# Patient Record
Sex: Female | Born: 2000 | Race: Black or African American | Hispanic: No | Marital: Single | Smoking: Never smoker
Health system: Southern US, Community
[De-identification: ages and names within clinical notes are randomized; demographics above are authoritative.]

## PROBLEM LIST (undated history)

## (undated) DIAGNOSIS — D649 Anemia, unspecified: Secondary | ICD-10-CM

## (undated) HISTORY — DX: Anemia, unspecified: D64.9

---

## 2019-02-22 ENCOUNTER — Encounter: Payer: Self-pay | Admitting: Family Medicine

## 2019-02-22 DIAGNOSIS — Z0289 Encounter for other administrative examinations: Secondary | ICD-10-CM | POA: Insufficient documentation

## 2019-02-26 ENCOUNTER — Ambulatory Visit (INDEPENDENT_AMBULATORY_CARE_PROVIDER_SITE_OTHER): Payer: Self-pay | Admitting: Family Medicine

## 2019-02-26 ENCOUNTER — Other Ambulatory Visit (HOSPITAL_COMMUNITY)
Admission: RE | Admit: 2019-02-26 | Discharge: 2019-02-26 | Disposition: A | Discharge status: Home / Self Care | Payer: Medicaid Other | Source: Ambulatory Visit | Attending: Family Medicine | Admitting: Family Medicine

## 2019-02-26 ENCOUNTER — Other Ambulatory Visit: Payer: Self-pay

## 2019-02-26 VITALS — BP 108/68 | HR 73 | Ht 67.5 in | Wt 119.8 lb

## 2019-02-26 DIAGNOSIS — Z0289 Encounter for other administrative examinations: Secondary | ICD-10-CM | POA: Diagnosis not present

## 2019-02-26 DIAGNOSIS — D649 Anemia, unspecified: Secondary | ICD-10-CM | POA: Insufficient documentation

## 2019-02-26 DIAGNOSIS — R040 Epistaxis: Secondary | ICD-10-CM | POA: Insufficient documentation

## 2019-02-26 DIAGNOSIS — M25561 Pain in right knee: Secondary | ICD-10-CM

## 2019-02-26 DIAGNOSIS — M25562 Pain in left knee: Secondary | ICD-10-CM

## 2019-02-26 DIAGNOSIS — G8929 Other chronic pain: Secondary | ICD-10-CM | POA: Insufficient documentation

## 2019-02-26 DIAGNOSIS — R3121 Asymptomatic microscopic hematuria: Secondary | ICD-10-CM | POA: Insufficient documentation

## 2019-02-26 DIAGNOSIS — Z113 Encounter for screening for infections with a predominantly sexual mode of transmission: Secondary | ICD-10-CM

## 2019-02-26 LAB — POCT UA - MICROSCOPIC ONLY

## 2019-02-26 LAB — POCT URINALYSIS DIP (MANUAL ENTRY)
Bilirubin, UA: NEGATIVE
Glucose, UA: NEGATIVE mg/dL
Leukocytes, UA: NEGATIVE
Nitrite, UA: NEGATIVE
Protein Ur, POC: NEGATIVE mg/dL
Spec Grav, UA: >=1.03 — AB (ref 1.010–1.025)
Urobilinogen, UA: 0.2 E.U./dL
pH, UA: 5.5 (ref 5.0–8.0)

## 2019-02-26 MED ORDER — IBUPROFEN 400 MG PO TABS: 400.0000 mg | ORAL_TABLET | Freq: Four times a day (QID) | ORAL | 1 refills | Status: DC | PRN

## 2019-02-26 NOTE — Progress Notes (Signed)
Patient Name: Melanie Maddox Date of Birth: 20-Nov-2000 Date of Visit: 02/26/19 PCP: Cleophas Dunker, DO  Chief Complaint: refugee intake examination and anemia, leg pain   The patient's preferred language is [place]. An interpreter was used for the entire visit.  Interpreter Name or ID: Bedor    Subjective: Melanie Maddox is a pleasant 18 y.o. presenting today for an initial refugee and immigrant clinic visit.    ROS: Review of Systems  Constitutional: Negative for chills and fever.  HENT: Negative for congestion and sore throat.   Eyes: Negative for blurred vision.  Respiratory: Negative for shortness of breath.   Cardiovascular: Negative for chest pain.  Gastrointestinal: Negative for abdominal pain, blood in stool and diarrhea.  Genitourinary: Negative for dysuria and hematuria.  Musculoskeletal:       Bilateral leg "bone" pain  Neurological: Negative for dizziness and headaches.  Endo/Heme/Allergies: Bruises/bleeds easily.       Frequent nose bleeds, three times in the last week.  "When I'm in a hot place."  Psychiatric/Behavioral: Negative for suicidal ideas.      PMH: Anemia Menstruating since age 70 LMP 7/14, regular, but occasionally heavy, painful  PSH: None   FH: None  Current Medications:  None   Refugee Information Number of Immediate Family Members: 5 Number of Immediate Family Members in [place]: 5 Date of Arrival: 02/06/19 Country of Birth: [place] Country of Origin: [place] Location of Refugee Camp: Other Other Location of [place]:: [place] Duration in [place]: 16-19 years Reason for [place]: Other Other Reason for Automatic Data:: patient unsure, told that family was having problems there Primary Language: Arabic Able to Read in Primary Language: No Able to Write in Primary Language: No Education: Primary School(Has completed up to grade 8) Marital Status: Single Sexual Activity: No Tuberculosis  Screening Overseas: Negative Tuberculosis Screening Health Department: Not Completed Health Department Labs Completed: No History of Trauma: None Do You Feel Jumpy or Nervous?: No Are You Very Watchful or 'Super Alert'?: No   Date of [place] Exam: 10/02/2018 Review of [place] Exam: 02/26/2019 Pre-Departure Treatment: Yes   Anemia Notes that she was talking medications for this in [place] 3-4 years ago.  No chest pain, SOB, dizziness. Notes occasionally heavy, painful periods.  Leg Pain "Bone pain" if she stands too long or walks a long distance.  Usually worse at the end of the day.  Notes that pain does not change depending on type of shoes that she is wearing.  Has been happening for 3-4 years.  Has taken a medication for pain over the counter in [place] that she notes helped the pain.     Vitals:   02/26/19 1442  BP: 108/68  Pulse: 73  SpO2: 100%   HEENT: Sclera anicteric. Dentition is good . Appears well hydrated. Neck: Supple Cardiac: Regular rate and rhythm. Normal S1/S2. No murmurs, rubs, or gallops appreciated. Lungs: Clear bilaterally to ascultation.  Abdomen: Normoactive bowel sounds. No tenderness to deep or light palpation. No rebound or guarding. Splenomegaly not palpated.  Extremities: Warm, well perfused without edema.  Skin: warm and dry  Psych: Pleasant and appropriate  MSK: moves all four extremities equally  Bilateral Legs: no visual abnormalities, mild TTP medial patellar retinaculum, full knee and hip ROM in all fields, 2+ posterior tibialis pulses, sensation intact throughout BLE, no TTP length of b/l femur and tibia, negative anterior drawer, negative Lachman, negative McMurray's bilaterally   [name] was seen today for establish  care.  Diagnoses and all orders for this visit:  Refugee health examination -     Hepatitis B core antibody, total -     Hepatitis B surface antigen -     Hepatitis c antibody (reflex) -     Comprehensive metabolic panel -      CBC with Differential -     HIV antibody (with reflex) -     RPR -     HGB FRAC. 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  Prescribe ibuprofen prn for pain.  F/u 1 month.  Anemia Unspecified.  Per patient report.  No CP, SOB, dizziness.  Has not been "on medication" in 3-4 years.  Suspect IDA given age and hx of heavy menstruation.  Offered patient OCPs but she declined at this time.  Will f/u CBC for diagnosis.    Frequent epistaxis Could consider dry skin, digital trauma, thrombocytopenia as cause. Will obtain CBC and f/u.  Keep nose moist.  If CBC WNL, can start nasal saline spray,  Asymptomatic microscopic hematuria Seen on Urine dipstick.  Will repeat in 1 month.    [facility] and PCP, Dr. Obie DredgeMeccariello.   Vaccines: None given Recommend HPV at follow up.    Patient seen and discussed with resident physician.  I discussed the plan of care with the resident physician and agree with below documentation.  Terisa Starrarina Brown, MD

## 2019-02-26 NOTE — Patient Instructions (Addendum)
Thank you for coming to see me today. It was a pleasure. Today we talked about:   Your labs that you need because you are a refugee.  We will discuss them at your appointment in 1 month.  Your leg pain: take ibuprofen as needed for your pain.  You can pick it up at the pharmacy.  Please follow-up with me in 1 month.  If you have any questions or concerns, please do not hesitate to call the office at [phone].  Best,   [place] Constable, DO

## 2019-02-26 NOTE — Assessment & Plan Note (Signed)
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  Prescribe ibuprofen prn for pain.  F/u 1 month.

## 2019-02-26 NOTE — Assessment & Plan Note (Signed)
Unspecified.  Per patient report.  No CP, SOB, dizziness.  Has not been "on medication" in 3-4 years.  Suspect IDA given age and hx of heavy menstruation.  Offered patient OCPs but she declined at this time.  Will f/u CBC for diagnosis.

## 2019-02-26 NOTE — Assessment & Plan Note (Signed)
Could consider dry skin, digital trauma, thrombocytopenia as cause. Will obtain CBC and f/u.  Keep nose moist.  If CBC WNL, can start nasal saline spray,

## 2019-02-26 NOTE — Assessment & Plan Note (Addendum)
Seen on Urine dipstick. Microscopy negative for RBC, no repeat needed.

## 2019-02-27 LAB — URINE CYTOLOGY ANCILLARY ONLY
Chlamydia: NEGATIVE
Neisseria Gonorrhea: NEGATIVE

## 2019-02-28 LAB — COMPREHENSIVE METABOLIC PANEL
ALT: <5 IU/L (ref 0–32)
AST: 15 IU/L (ref 0–40)
Albumin/Globulin Ratio: 1.6 (ref 1.2–2.2)
Albumin: 4.9 g/dL (ref 3.9–5.0)
Alkaline Phosphatase: 78 IU/L (ref 43–101)
BUN/Creatinine Ratio: 25 — ABNORMAL HIGH (ref 9–23)
BUN: 16 mg/dL (ref 6–20)
Bilirubin Total: <0.2 mg/dL (ref 0.0–1.2)
CO2: 19 mmol/L — ABNORMAL LOW (ref 20–29)
Calcium: 9.8 mg/dL (ref 8.7–10.2)
Chloride: 102 mmol/L (ref 96–106)
Creatinine, Ser: 0.63 mg/dL (ref 0.57–1.00)
GFR calc Af Amer: 151 mL/min/{1.73_m2} (ref >59)
GFR calc non Af Amer: 131 mL/min/{1.73_m2} (ref >59)
Globulin, Total: 3.1 g/dL (ref 1.5–4.5)
Glucose: 73 mg/dL (ref 65–99)
Potassium: 4.6 mmol/L (ref 3.5–5.2)
Sodium: 140 mmol/L (ref 134–144)
Total Protein: 8 g/dL (ref 6.0–8.5)

## 2019-02-28 LAB — VARICELLA ZOSTER ANTIBODY, IGG: Varicella zoster IgG: 1224 index (ref >165)

## 2019-02-28 LAB — CBC WITH DIFFERENTIAL/PLATELET
Basophils Absolute: 0 10*3/uL (ref 0.0–0.2)
Basos: 1 %
EOS (ABSOLUTE): 0 10*3/uL (ref 0.0–0.4)
Eos: 0 %
Hematocrit: 36.4 % (ref 34.0–46.6)
Hemoglobin: 11.4 g/dL (ref 11.1–15.9)
Immature Grans (Abs): 0 10*3/uL (ref 0.0–0.1)
Immature Granulocytes: 0 %
Lymphocytes Absolute: 1.7 10*3/uL (ref 0.7–3.1)
Lymphs: 33 %
MCH: 25.7 pg — ABNORMAL LOW (ref 26.6–33.0)
MCHC: 31.3 g/dL — ABNORMAL LOW (ref 31.5–35.7)
MCV: 82 fL (ref 79–97)
Monocytes Absolute: 0.3 10*3/uL (ref 0.1–0.9)
Monocytes: 6 %
Neutrophils Absolute: 3 10*3/uL (ref 1.4–7.0)
Neutrophils: 60 %
Platelets: 254 10*3/uL (ref 150–450)
RBC: 4.43 x10E6/uL (ref 3.77–5.28)
RDW: 13.2 % (ref 11.7–15.4)
WBC: 5.2 10*3/uL (ref 3.4–10.8)

## 2019-02-28 LAB — HGB FRAC. W/SOLUBILITY
Hgb A2 Quant: 1.8 % (ref 1.8–3.2)
Hgb A: 98.2 % (ref 96.4–98.8)
Hgb C: 0 %
Hgb F Quant: 0 % (ref 0.0–2.0)
Hgb S: 0 %
Hgb Solubility: NEGATIVE
Hgb Variant: 0 %

## 2019-02-28 LAB — QUANTIFERON-TB GOLD PLUS
QuantiFERON Mitogen Value: >10 IU/mL
QuantiFERON Nil Value: 0.05 IU/mL
QuantiFERON TB1 Ag Value: 0.06 IU/mL
QuantiFERON TB2 Ag Value: 0.07 IU/mL
QuantiFERON-TB Gold Plus: NEGATIVE

## 2019-02-28 LAB — HEPATITIS B SURFACE ANTIGEN: Hepatitis B Surface Ag: NEGATIVE

## 2019-02-28 LAB — HEPATITIS C ANTIBODY (REFLEX): HCV Ab: <0.1 s/co ratio (ref 0.0–0.9)

## 2019-02-28 LAB — HEPATITIS B SURFACE ANTIBODY, QUANTITATIVE: Hepatitis B Surf Ab Quant: 436.1 m[IU]/mL (ref >9.9)

## 2019-02-28 LAB — HIV ANTIBODY (ROUTINE TESTING W REFLEX): HIV Screen 4th Generation wRfx: NONREACTIVE

## 2019-02-28 LAB — HEPATITIS B CORE ANTIBODY, TOTAL: Hep B Core Total Ab: NEGATIVE

## 2019-02-28 LAB — HCV COMMENT:

## 2019-02-28 LAB — RPR: RPR Ser Ql: NONREACTIVE

## 2019-03-29 ENCOUNTER — Ambulatory Visit: Payer: Self-pay | Admitting: Family Medicine

## 2019-04-01 ENCOUNTER — Ambulatory Visit: Payer: Self-pay | Admitting: Family Medicine

## 2019-04-16 ENCOUNTER — Other Ambulatory Visit: Payer: Self-pay

## 2019-04-16 ENCOUNTER — Encounter: Payer: Self-pay | Admitting: Family Medicine

## 2019-04-16 ENCOUNTER — Ambulatory Visit (INDEPENDENT_AMBULATORY_CARE_PROVIDER_SITE_OTHER): Payer: Medicaid Other | Admitting: Family Medicine

## 2019-04-16 VITALS — BP 98/72 | HR 69 | Wt 119.2 lb

## 2019-04-16 DIAGNOSIS — M25562 Pain in left knee: Secondary | ICD-10-CM

## 2019-04-16 DIAGNOSIS — R3121 Asymptomatic microscopic hematuria: Secondary | ICD-10-CM

## 2019-04-16 DIAGNOSIS — M25561 Pain in right knee: Secondary | ICD-10-CM

## 2019-04-16 DIAGNOSIS — G8929 Other chronic pain: Secondary | ICD-10-CM

## 2019-04-16 DIAGNOSIS — Z712 Person consulting for explanation of examination or test findings: Secondary | ICD-10-CM | POA: Diagnosis not present

## 2019-04-16 LAB — POCT URINALYSIS DIP (MANUAL ENTRY)
Bilirubin, UA: NEGATIVE
Blood, UA: NEGATIVE
Glucose, UA: NEGATIVE mg/dL
Ketones, POC UA: NEGATIVE mg/dL
Leukocytes, UA: NEGATIVE
Nitrite, UA: NEGATIVE
Protein Ur, POC: NEGATIVE mg/dL
Spec Grav, UA: >=1.03 — AB (ref 1.010–1.025)
Urobilinogen, UA: 0.2 E.U./dL
pH, UA: 6 (ref 5.0–8.0)

## 2019-04-16 MED ORDER — IBUPROFEN 400 MG PO TABS: 400.0000 mg | ORAL_TABLET | Freq: Four times a day (QID) | ORAL | 1 refills | Status: DC | PRN

## 2019-04-16 NOTE — Patient Instructions (Addendum)
Thank you for coming to see me today. It was a pleasure. Today we talked about:   Your leg pain: continue ibuprofen as needed.  Do the exercises I showed you at least twice a day.  Ice your knees at night.  Get some sport shoes to use when you are walking long distances and working out.  If you aren't better in a month, come back.  If you have any questions or concerns, please do not hesitate to call the office at [phone].  Best,   [place] Constable, DO

## 2019-04-16 NOTE — Assessment & Plan Note (Signed)
Discussed with patient that all labs within normal limits.  She voiced understanding.  Advised to continue to follow-up with health department.

## 2019-04-16 NOTE — Progress Notes (Addendum)
Subjective: Chief Complaint  Patient presents with  . Leg Pain     HPI: Melanie Maddox is a 18 y.o. presenting to clinic today to discuss the following:  1 Discuss Lab Results Will discuss lab results of initial refugee examination labs.  Reports has follow up at health department.  2 Leg Pain Pain in BLE from upper thigh to lower calf.  Describes as pain in the bones.  Worsens with walking, every time she walks "a long time."  Doesn't hurt if she stands or walks for a short time.  Pain also when standing a long time.  No pain while resting.  Iburpofen improved pain only slightly.  Has been having pain for 3-4 years, but now she feels as though it is getting worse.  States that she doesn't have tennis shoes.  Pain with bending legs as well.  Rates pain 10/10.  Notes that pain sometimes occurs at night after she has walked a long time.  Mostly wears sandals during the day.  States that she has been working out at home, doing [employer] workouts.  States that she does run, and pain is usually worse after running.   3 Microscopic Hematuria At last visit, patient with microscopic hematuria.  Plan to repeat UA today.  Patient denies known hematuria.     ROS noted in HPI.   Past Medical, Surgical, Social, and Family History Reviewed & Updated per EMR.   Pertinent Historical Findings include:   Social History   Tobacco Use  Smoking Status Passive Smoke Exposure - Never Smoker  Smokeless Tobacco Never Used      Objective: BP 98/72   Pulse 69   Wt 119 lb 3.2 oz (54.1 kg)   LMP 03/22/2019 (Approximate)   SpO2 100%   BMI 18.39 kg/m  Vitals and nursing notes reviewed  Physical Exam:  General: 18 y.o. female in NAD Cardio: RRR no m/r/g Lungs: CTAB, no wheezing, no rhonchi, no crackles, no IWOB on RA Skin: warm and dry Extremities: No edema  Lower Extremities: Thin BLE without obvious deformity, normal gait, no TTP b/l femur, tibia, medial or lateral knee  joint lines, b/l patella, no pain with ROM of b/l hip or knee, negative McMurrey's bilaterally, negative Clark's sign bilaterally   Results for orders placed or performed in visit on 04/16/19 (from the past 72 hour(s))  POCT urinalysis dipstick     Status: Abnormal   Collection Time: 04/16/19  8:57 AM  Result Value Ref Range   Color, UA yellow yellow   Clarity, UA clear clear   Glucose, UA negative negative mg/dL   Bilirubin, UA negative negative   Ketones, POC UA negative negative mg/dL   Spec Grav, UA >=1.610>=1.030 (A) 1.010 - 1.025   Blood, UA negative negative   pH, UA 6.0 5.0 - 8.0   Protein Ur, POC negative negative mg/dL   Urobilinogen, UA 0.2 0.2 or 1.0 E.U./dL   Nitrite, UA Negative Negative   Leukocytes, UA Negative Negative    Assessment/Plan:  Encounter to discuss test results Discussed with patient that all labs within normal limits.  She voiced understanding.  Advised to continue to follow-up with health department.  Asymptomatic microscopic hematuria UA repeated, no hematuria.  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  Advised patient to obtain shoes with good support to use for exercise and when walking long distances.  Will continue ibuprofen as needed, ice at night.  Patient shown exercises to increase strength and quad muscles and discussed with patient diagnosis.  Follow-up in 1 month if no improvement, at that time could consider imaging or physical therapy referral.  Of note, patient does note it would likely be difficult for her to get to physical therapy given that she rides the bus everywhere she goes.     PATIENT EDUCATION PROVIDED: See AVS    Diagnosis and plan along with any newly prescribed medication(s) were discussed in detail with this patient today. The patient verbalized understanding and agreed with the plan. Patient advised if symptoms  worsen return to clinic or ER.     Orders Placed This Encounter  Procedures  . POCT urinalysis dipstick    Meds ordered this encounter  Medications  . ibuprofen (ADVIL) 400 MG tablet    Sig: Take 1 tablet (400 mg total) by mouth every 6 (six) hours as needed.    Dispense:  30 tablet    Refill:  Yancey, DO 04/16/2019, 9:32 AM PGY-2 [facility]

## 2019-04-16 NOTE — Assessment & Plan Note (Signed)
UA repeated, no hematuria.

## 2019-04-16 NOTE — Assessment & Plan Note (Addendum)
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  Advised patient to obtain shoes with good support to use for exercise and when walking long distances.  Will continue ibuprofen as needed, ice at night.  Patient shown exercises to increase strength and quad muscles and discussed with patient diagnosis.  Follow-up in 1 month if no improvement, at that time could consider imaging or physical therapy referral.  Of note, patient does note it would likely be difficult for her to get to physical therapy given that she rides the bus everywhere she goes.

## 2019-05-28 ENCOUNTER — Telehealth: Payer: Self-pay | Admitting: Family Medicine

## 2019-05-28 NOTE — Telephone Encounter (Signed)
[facility] Assessment form dropped off for school at front desk for completion.  Verified that patient section of form has been completed.  Last DOS/WCC with PCP was 04/16/2019.  Placed form in team folder to be completed by clinical staff.  Eldred Manges Magtoto

## 2019-05-29 ENCOUNTER — Ambulatory Visit: Payer: Medicaid Other

## 2019-05-29 NOTE — Progress Notes (Deleted)
   Subjective:    Patient ID: Melanie Maddox, female    DOB: 2000-11-03, 18 y.o.   MRN: [id number]   CC: "check up"   HPI: Melanie Maddox is an 18 year old recent refugee female with history of bilateral chronic knee pain and anemia presenting discuss the following:    Smoking status reviewed  Review of Systems Per HPI   Objective:  There were no vitals taken for this visit. Vitals and nursing note reviewed  General: NAD, pleasant Cardiac: RRR, normal heart sounds, no murmurs Respiratory: CTAB, normal effort Abdomen: soft, nontender, nondistended Extremities: no edema or cyanosis. WWP. Skin: warm and dry, no rashes noted Neuro: alert and oriented, no focal deficits Psych: normal affect  Assessment & Plan:    No problem-specific Assessment & Plan notes found for this encounter.    [facility] Resident PGY-2

## 2019-06-03 DIAGNOSIS — Z23 Encounter for immunization: Secondary | ICD-10-CM | POA: Diagnosis not present

## 2019-06-04 NOTE — Telephone Encounter (Signed)
Reviewed, completed, and signed form.  Note routed to RN team inbasket and placed completed form in Clinic RN's office (wall pocket above desk).  Bernita Raisin Camran Keady, DO  Ensure vision screening when they come.  See April's note below for when you call.  Will also needs this filled out on form when completed.

## 2019-06-04 NOTE — Telephone Encounter (Signed)
Clinical info completed on School form.  Place form in Dr. Bella Kennedy box for completion. May attempt vision on pt for form even though she is 42. Please inform pt to be sure to come when the forms are picked up to have this done.  Melanie Maddox, CMA

## 2019-06-05 NOTE — Telephone Encounter (Signed)
Scheduled for 10/30- forms are at my desk in nurse room.

## 2019-06-07 ENCOUNTER — Other Ambulatory Visit: Payer: Self-pay

## 2019-06-07 ENCOUNTER — Ambulatory Visit (INDEPENDENT_AMBULATORY_CARE_PROVIDER_SITE_OTHER): Payer: Medicaid Other

## 2019-06-07 DIAGNOSIS — Z0110 Encounter for examination of ears and hearing without abnormal findings: Secondary | ICD-10-CM | POA: Diagnosis not present

## 2019-06-07 DIAGNOSIS — Z0100 Encounter for examination of eyes and vision without abnormal findings: Secondary | ICD-10-CM | POA: Diagnosis not present

## 2019-06-07 NOTE — Progress Notes (Signed)
Patient presents in nurse clinic for hearing and vision screening for school form.  Passed hearing and vision. Completed form given to patient.

## 2019-07-29 DIAGNOSIS — Z23 Encounter for immunization: Secondary | ICD-10-CM | POA: Diagnosis not present

## 2019-08-12 DIAGNOSIS — H1013 Acute atopic conjunctivitis, bilateral: Secondary | ICD-10-CM | POA: Diagnosis not present

## 2019-08-12 DIAGNOSIS — Z87828 Personal history of other (healed) physical injury and trauma: Secondary | ICD-10-CM | POA: Diagnosis not present

## 2019-08-20 DIAGNOSIS — K011 Impacted teeth: Secondary | ICD-10-CM | POA: Diagnosis not present

## 2019-09-05 ENCOUNTER — Ambulatory Visit (INDEPENDENT_AMBULATORY_CARE_PROVIDER_SITE_OTHER): Payer: Medicaid Other | Admitting: Family Medicine

## 2019-09-05 ENCOUNTER — Other Ambulatory Visit: Payer: Self-pay

## 2019-09-05 VITALS — BP 102/58 | HR 85 | Wt 124.4 lb

## 2019-09-05 DIAGNOSIS — L853 Xerosis cutis: Secondary | ICD-10-CM

## 2019-09-05 NOTE — Progress Notes (Signed)
     Subjective: Chief Complaint  Patient presents with  . Rash     HPI: Melanie Maddox is a 19 y.o. presenting to clinic today to discuss the following:  1 Rash  Has had for about 1 month, but previously has had off and on for many years Has not tried anything except baby powder Itches, is not red Unsure if it happens during a certain time of year No difficulty breathing, hx asthma, fevers She is otherwise feeling well     ROS noted in HPI. Chief complaint noted.  Other Pertinent PMH: Recent Refugee, Heavy Periods, Frequent Nosebleeds Past Medical, Surgical, Social, and Family History Reviewed & Updated per EMR.      Social History   Tobacco Use  Smoking Status Passive Smoke Exposure - Never Smoker  Smokeless Tobacco Never Used   Smoking status noted.    Objective: BP (!) 102/58   Pulse 85   Wt 124 lb 6.4 oz (56.4 kg)   LMP 08/19/2019 (Exact Date)   SpO2 100%   BMI 19.20 kg/m  Vitals and nursing notes reviewed  Physical Exam:  General: 19 y.o. female in NAD Lungs: Breathing comfortably on room air Skin: Dry, slightly flaking skin on back, no erythema or rash noted Extremities: Moves all 4 extremities without difficulty   No results found for this or any previous visit (from the past 72 hour(s)).  Assessment/Plan:  Dry skin No rash on my exam.  Appears to have dry skin.  Discussed with patient that during cold months it is very common to have dry skin.  Advised obtaining unscented cream from the pharmacy or supermarket such as Cetaphil, Aquaphor, Eucerin, or another brand that is thick and unscented.  She voiced understanding of this.  Follow-up if no improvement.     PATIENT EDUCATION PROVIDED: See AVS    Diagnosis and plan along with any newly prescribed medication(s) were discussed in detail with this patient today. The patient verbalized understanding and agreed with the plan. Patient advised if symptoms worsen return to clinic or ER.      No orders of the defined types were placed in this encounter.   No orders of the defined types were placed in this encounter.    Luis Abed, DO 09/05/2019, 2:36 PM PGY-2 [facility]

## 2019-09-05 NOTE — Assessment & Plan Note (Signed)
No rash on my exam.  Appears to have dry skin.  Discussed with patient that during cold months it is very common to have dry skin.  Advised obtaining unscented cream from the pharmacy or supermarket such as Cetaphil, Aquaphor, Eucerin, or another brand that is thick and unscented.  She voiced understanding of this.  Follow-up if no improvement.

## 2019-09-05 NOTE — Patient Instructions (Signed)
Thank you for coming to see me today. It was a pleasure. Today we talked about:   Your skin is very dry: use Cetaphil, Aquaphor, or Eucerin creams that do not have scent in them.  You can use them all over the body.  The air can be very drying in the winter, so it is important to use lotion to keep your skin moist.  If you have any questions or concerns, please do not hesitate to call the office at [phone].  Best,   Luis Abed, DO

## 2019-10-24 DIAGNOSIS — K011 Impacted teeth: Secondary | ICD-10-CM | POA: Diagnosis not present

## 2019-10-24 DIAGNOSIS — K029 Dental caries, unspecified: Secondary | ICD-10-CM | POA: Diagnosis not present

## 2020-01-29 DIAGNOSIS — Z23 Encounter for immunization: Secondary | ICD-10-CM | POA: Diagnosis not present

## 2020-03-05 DIAGNOSIS — Z23 Encounter for immunization: Secondary | ICD-10-CM | POA: Diagnosis not present

## 2020-03-19 ENCOUNTER — Other Ambulatory Visit: Payer: Self-pay

## 2020-03-19 ENCOUNTER — Telehealth: Payer: Self-pay

## 2020-03-19 ENCOUNTER — Ambulatory Visit (INDEPENDENT_AMBULATORY_CARE_PROVIDER_SITE_OTHER): Payer: Medicaid Other | Admitting: Family Medicine

## 2020-03-19 VITALS — BP 98/68 | HR 79 | Ht 67.5 in | Wt 116.0 lb

## 2020-03-19 DIAGNOSIS — Z3009 Encounter for other general counseling and advice on contraception: Secondary | ICD-10-CM | POA: Diagnosis not present

## 2020-03-19 LAB — POCT URINE PREGNANCY: Preg Test, Ur: NEGATIVE

## 2020-03-19 MED ORDER — NORGESTIMATE-ETH ESTRADIOL 0.25-35 MG-MCG PO TABS: 1.0000 | ORAL_TABLET | Freq: Every day | ORAL | 3 refills | Status: DC

## 2020-03-19 NOTE — Telephone Encounter (Signed)
Patient calls nurse line stating that she changed her mind and does not want to use birth control pills. Patient states that she would like to be started on depo provera injections.  Please advise if patient can be scheduled for nurse visit to begin depo provera.   To PCP  Veronda Prude, RN

## 2020-03-19 NOTE — Patient Instructions (Signed)
Thank you for coming to see me today. It was a pleasure. Today we talked about:   I sent your pills to the pharmacy.  Start on the first day of your period.  Use condoms if you have sex to prevent sexually transmitted diseases.  Please follow-up with me in 1 year or sooner as needed.  If you have any questions or concerns, please do not hesitate to call the office at [phone].  Best,   Luis Abed, DO    Oral Contraception Use Oral contraceptive pills (OCPs) are medicines that you take to prevent pregnancy. OCPs work by:  Preventing the ovaries from releasing eggs.  Thickening mucus in the lower part of the uterus (cervix), which prevents sperm from entering the uterus.  Thinning the lining of the uterus (endometrium), which prevents a fertilized egg from attaching to the endometrium. OCPs are highly effective when taken exactly as prescribed. However, OCPs do not prevent sexually transmitted infections (STIs). Safe sex practices, such as using condoms while on an OCP, can help prevent STIs. Before taking OCPs, you may have a physical exam, blood test, and Pap test. A Pap test involves taking a sample of cells from your cervix to check for cancer. Discuss with your health care provider the possible side effects of the OCP you may be prescribed. When you start an OCP, be aware that it can take 2-3 months for your body to adjust to changes in hormone levels. How to take oral contraceptive pills Follow instructions from your health care provider about how to start taking your first cycle of OCPs. Your health care provider may recommend that you:  Start the pill on day 1 of your menstrual period. If you start at this time, you will not need any backup form of birth control (contraception), such as condoms.  Start the pill on the first Sunday after your menstrual period or on the day you get your prescription. In these cases, you will need to use backup contraception for the  first week.  Start the pill at any time of your cycle. ? If you take the pill within 5 days of the start of your period, you will not need a backup form of contraception. ? If you start at any other time of your menstrual cycle, you will need to use another form of contraception for 7 days. If your OCP is the type called a minipill, it will protect you from pregnancy after taking it for 2 days (48 hours), and you can stop using backup contraception after that time. After you have started taking OCPs:  If you forget to take 1 pill, take it as soon as you remember. Take the next pill at the regular time.  If you miss 2 or more pills, call your health care provider. Different pills have different instructions for missed doses. Use backup birth control until your next menstrual period starts.  If you use a 28-day pack that contains inactive pills and you miss 1 of the last 7 pills (pills with no hormones), throw away the rest of the non-hormone pills and start a new pill pack. No matter which day you start the OCP, you will always start a new pack on that same day of the week. Have an extra pack of OCPs and a backup contraceptive method available in case you miss some pills or lose your OCP pack. Follow these instructions at home:  Do not use any products that contain nicotine or tobacco, such as  cigarettes and e-cigarettes. If you need help quitting, ask your health care provider.  Always use a condom to protect against STIs. OCPs do not protect against STIs.  Use a calendar to mark the days of your menstrual period.  Read the information and directions that came with your OCP. Talk to your health care provider if you have questions. Contact a health care provider if:  You develop nausea and vomiting.  You have abnormal vaginal discharge or bleeding.  You develop a rash.  You miss your menstrual period. Depending on the type of OCP you are taking, this may be a sign of pregnancy. Ask your  health care provider for more information.  You are losing your hair.  You need treatment for mood swings or depression.  You get dizzy when taking the OCP.  You develop acne after taking the OCP.  You become pregnant or think you may be pregnant.  You have diarrhea, constipation, and abdominal pain or cramps.  You miss 2 or more pills. Get help right away if:  You develop chest pain.  You develop shortness of breath.  You have an uncontrolled or severe headache.  You develop numbness or slurred speech.  You develop visual or speech problems.  You develop pain, redness, and swelling in your legs.  You develop weakness or numbness in your arms or legs. Summary  Oral contraceptive pills (OCPs) are medicines that you take to prevent pregnancy.  OCPs do not prevent sexually transmitted infections (STIs). Always use a condom to protect against STIs.  When you start an OCP, be aware that it can take 2-3 months for your body to adjust to changes in hormone levels.  Read all the information and directions that come with your OCP. This information is not intended to replace advice given to you by your health care provider. Make sure you discuss any questions you have with your health care provider. Document Revised: 11/16/2018 Document Reviewed: 09/05/2016 Elsevier Patient Education  2020 [employer].

## 2020-03-19 NOTE — Progress Notes (Signed)
° ° °  SUBJECTIVE:   CHIEF COMPLAINT / HPI:   Contraceptive counseling Sexually active: no, possibly will be in future LMP: 7/11 Intercourse in the last 2 weeks: no Attempting to conceive: no Requests STI testing: no History of STI: no  PERTINENT  PMH / PSH: Non-contributory  OBJECTIVE:   BP 98/68    Pulse 79    Ht 5' 7.5" (1.715 m)    Wt 116 lb (52.6 kg)    LMP 02/16/2020 (Exact Date)    SpO2 100%    BMI 17.90 kg/m    Physical Exam:  General: 19 y.o. female in NAD Lungs: Breathing comfortably on room air Skin: warm and dry Extremities: No edema, ambulating without difficulty  Results for orders placed or performed in visit on 03/19/20 (from the past 24 hour(s))  POCT urine pregnancy     Status: None   Collection Time: 03/19/20 12:00 PM  Result Value Ref Range   Preg Test, Ur Negative Negative      ASSESSMENT/PLAN:   Encounter for counseling regarding contraception The risks and benefits were dicussed with patient regarding the following forms of birth control: oral contraceptive pills, Depo-Provera shot, Nuva-Ring, hormonal IUD, copper IUD, Nexplanon, and condoms.  The patient decided on OCPs.  She was counseled on possible side effects of this method.  She denies a history of migraines with aura, DVT, PE, and family history of DVT or PE.  Sprintec prescribed.  Patient counseled on appropriate use and given further information.  She will start this on the first day of her next period, which will likely be in the next day or 2.     Unknown Jim, DO [facility]

## 2020-03-19 NOTE — Assessment & Plan Note (Addendum)
The risks and benefits were dicussed with patient regarding the following forms of birth control: oral contraceptive pills, Depo-Provera shot, Nuva-Ring, hormonal IUD, copper IUD, Nexplanon, and condoms.  The patient decided on OCPs.  She was counseled on possible side effects of this method.  She denies a history of migraines with aura, DVT, PE, and family history of DVT or PE.  Sprintec prescribed.  Patient counseled on appropriate use and given further information.  She will start this on the first day of her next period, which will likely be in the next day or 2.

## 2020-03-23 NOTE — Telephone Encounter (Signed)
Check POC pregnancy test at appointment, but if negative, okay to start.

## 2020-03-23 NOTE — Telephone Encounter (Signed)
Called and LVM with patient to return call to office to schedule nurse visit.   Veronda Prude, RN

## 2020-03-27 ENCOUNTER — Ambulatory Visit: Payer: Medicaid Other

## 2020-04-04 ENCOUNTER — Ambulatory Visit (HOSPITAL_COMMUNITY)
Admission: EM | Admit: 2020-04-04 | Discharge: 2020-04-04 | Disposition: A | Discharge status: Home / Self Care | Payer: Medicaid Other | Attending: Physician Assistant | Admitting: Physician Assistant

## 2020-04-04 ENCOUNTER — Encounter (HOSPITAL_COMMUNITY): Payer: Self-pay

## 2020-04-04 ENCOUNTER — Other Ambulatory Visit: Payer: Self-pay

## 2020-04-04 DIAGNOSIS — Z3202 Encounter for pregnancy test, result negative: Secondary | ICD-10-CM

## 2020-04-04 DIAGNOSIS — R112 Nausea with vomiting, unspecified: Secondary | ICD-10-CM | POA: Diagnosis not present

## 2020-04-04 LAB — POC URINE PREG, ED: Preg Test, Ur: NEGATIVE

## 2020-04-04 MED ORDER — ONDANSETRON HCL 4 MG PO TABS: 4.0000 mg | ORAL_TABLET | Freq: Four times a day (QID) | ORAL | 0 refills | Status: DC | PRN

## 2020-04-04 NOTE — ED Provider Notes (Signed)
[facility]    CSN: [id number] Arrival date & time: 04/04/20  1132      History   Chief Complaint Chief Complaint  Patient presents with  . Emesis    HPI Melanie Maddox is a 19 y.o. female.   Patient presents with 4-day history of nausea.  She states it started after she started taking oral contraceptive pills.  She has vomited twice during this time; none since yesterday.  She also reports one loose stool today.  She denies fever, chills, sore throat, cough, shortness of breath, abdominal pain, dysuria, or other symptoms.  No treatments attempted at home.  The history is provided by the patient.    Past Medical History:  Diagnosis Date  . Anemia     Patient Active Problem List   Diagnosis Date Noted  . Encounter for counseling regarding contraception 03/19/2020    History reviewed. No pertinent surgical history.  OB History    Gravida  0   Para  0   Term  0   Preterm  0   AB  0   Living  0     SAB  0   TAB  0   Ectopic  0   Multiple  0   Live Births  0        Obstetric Comments  Has been having having periods since age 68.          Home Medications    Prior to Admission medications   Medication Sig Start Date End Date Taking? Authorizing Provider  ibuprofen (ADVIL) 400 MG tablet Take 1 tablet (400 mg total) by mouth every 6 (six) hours as needed. 04/16/19   Meccariello, Solmon Ice, DO  norgestimate-ethinyl estradiol (ORTHO-CYCLEN) 0.25-35 MG-MCG tablet Take 1 tablet by mouth daily. 03/19/20   Meccariello, Solmon Ice, DO  ondansetron (ZOFRAN) 4 MG tablet Take 1 tablet (4 mg total) by mouth every 6 (six) hours as needed for nausea or vomiting. 04/04/20   Mickie Bail, NP    Family History Family History  Problem Relation Age of Onset  . Hypertension Mother     Social History Social History   Tobacco Use  . Smoking status: Never Smoker  . Smokeless tobacco: Never Used  Vaping Use  . Vaping Use: Never used  Substance  Use Topics  . Alcohol use: Never  . Drug use: Never     Allergies   Patient has no known allergies.   Review of Systems Review of Systems  Constitutional: Negative for chills and fever.  HENT: Negative for ear pain and sore throat.   Eyes: Negative for pain and visual disturbance.  Respiratory: Negative for cough and shortness of breath.   Cardiovascular: Negative for chest pain and palpitations.  Gastrointestinal: Positive for nausea and vomiting. Negative for abdominal pain, constipation and diarrhea.  Genitourinary: Negative for dysuria and hematuria.  Musculoskeletal: Negative for arthralgias and back pain.  Skin: Negative for color change and rash.  Neurological: Negative for seizures and syncope.  All other systems reviewed and are negative.    Physical Exam Triage Vital Signs ED Triage Vitals  Enc Vitals Group     BP 04/04/20 1350 128/90     Pulse Rate 04/04/20 1350 (!) 106     Resp 04/04/20 1350 18     Temp 04/04/20 1350 98.6 F (37 C)     Temp Source 04/04/20 1350 Oral     SpO2 04/04/20 1350 94 %  Weight 04/04/20 1351 125 lb (56.7 kg)     Height 04/04/20 1351 5\' 7"  (1.702 m)     Head Circumference --      Peak Flow --      Pain Score 04/04/20 1351 5     Pain Loc --      Pain Edu? --      Excl. in GC? --    No data found.  Updated Vital Signs BP 128/90   Pulse (!) 106   Temp 98.6 F (37 C) (Oral)   Resp 18   Ht 5\' 7"  (1.702 m)   Wt 125 lb (56.7 kg)   SpO2 94%   BMI 19.58 kg/m   Visual Acuity Right Eye Distance:   Left Eye Distance:   Bilateral Distance:    Right Eye Near:   Left Eye Near:    Bilateral Near:     Physical Exam Vitals and nursing note reviewed.  Constitutional:      General: She is not in acute distress.    Appearance: She is well-developed. She is not ill-appearing.  HENT:     Head: Normocephalic and atraumatic.     Mouth/Throat:     Mouth: Mucous membranes are moist.     Pharynx: Oropharynx is clear.  Eyes:      Conjunctiva/sclera: Conjunctivae normal.  Cardiovascular:     Rate and Rhythm: Normal rate and regular rhythm.     Heart sounds: No murmur heard.   Pulmonary:     Effort: Pulmonary effort is normal. No respiratory distress.     Breath sounds: Normal breath sounds.  Abdominal:     General: Bowel sounds are normal.     Palpations: Abdomen is soft.     Tenderness: There is no abdominal tenderness. There is no guarding or rebound.  Musculoskeletal:     Cervical back: Neck supple.  Skin:    General: Skin is warm and dry.     Findings: No rash.  Neurological:     General: No focal deficit present.     Mental Status: She is alert and oriented to person, place, and time.  Psychiatric:        Mood and Affect: Mood normal.        Behavior: Behavior normal.      UC Treatments / Results  Labs (all labs ordered are listed, but only abnormal results are displayed) Labs Reviewed  POC URINE PREG, ED    EKG   Radiology No results found.  Procedures Procedures (including critical care time)  Medications Ordered in UC Medications - No data to display  Initial Impression / Assessment and Plan / UC Course  I have reviewed the triage vital signs and the nursing notes.  Pertinent labs & imaging results that were available during my care of the patient were reviewed by me and considered in my medical decision making (see chart for details).   Non-intractable vomiting with nausea.  Urine pregnancy negative.  Treating nausea with Zofran.  Instructed patient to stay hydrated with clear liquids.  Discussed with patient that nausea can be an expected side effect of starting OCP.  Instructed her to follow-up with her PCP if her symptoms persist.  Patient agrees to plan of care.   Final Clinical Impressions(s) / UC Diagnoses   Final diagnoses:  Non-intractable vomiting with nausea, unspecified vomiting type     Discharge Instructions     Your urine pregnancy test is negative.     Take the antinausea  medicine as directed.  Keep yourself hydrated with clear liquids.    Follow up with your primary care provider if your symptoms are not improving.        ED Prescriptions    Medication Sig Dispense Auth. Provider   ondansetron (ZOFRAN) 4 MG tablet Take 1 tablet (4 mg total) by mouth every 6 (six) hours as needed for nausea or vomiting. 12 tablet Mickie Bail, NP     PDMP not reviewed this encounter.   Mickie Bail, NP 04/04/20 1421

## 2020-04-04 NOTE — Discharge Instructions (Addendum)
Your urine pregnancy test is negative.    Take the antinausea medicine as directed.  Keep yourself hydrated with clear liquids.    Follow up with your primary care provider if your symptoms are not improving.

## 2020-04-04 NOTE — ED Triage Notes (Signed)
Pt c/o N/Vx2 days. Pt had 1 bout of diarrhea today. PT had vomited twice in past 2 days. Pt LMP 02/16/2020

## 2020-05-13 ENCOUNTER — Other Ambulatory Visit: Payer: Self-pay

## 2020-05-13 ENCOUNTER — Encounter (HOSPITAL_COMMUNITY): Payer: Self-pay

## 2020-05-13 ENCOUNTER — Ambulatory Visit (HOSPITAL_COMMUNITY)
Admission: EM | Admit: 2020-05-13 | Discharge: 2020-05-13 | Disposition: A | Discharge status: Home / Self Care | Payer: Medicaid Other | Attending: Internal Medicine | Admitting: Internal Medicine

## 2020-05-13 DIAGNOSIS — M25562 Pain in left knee: Secondary | ICD-10-CM

## 2020-05-13 DIAGNOSIS — R0789 Other chest pain: Secondary | ICD-10-CM

## 2020-05-13 MED ORDER — IBUPROFEN 400 MG PO TABS: 400.0000 mg | ORAL_TABLET | Freq: Four times a day (QID) | ORAL | 1 refills | Status: AC | PRN

## 2020-05-13 NOTE — ED Provider Notes (Signed)
[facility]    CSN: [id number] Arrival date & time: 05/13/20  1725      History   Chief Complaint Chief Complaint  Patient presents with  . Chest Pain    right side     HPI Melanie Maddox is a 19 y.o. female comes to the urgent care with 1 day history of sharp pain on the right side of the chest and the right side of the back.  Patient was in bed playing with her phone when the pain started.  She denies any trauma to the chest or fall.  No shortness of breath.  Pain is of moderate severity, aggravated by taking a deep breath, ibuprofen gave her partial relief, no cough pain.  Patient denies any long distance travel.  She is on birth control which started a month ago.  No fever, chills, cough or sputum production.  No sick contacts. Past Medical History:  Diagnosis Date  . Anemia     Patient Active Problem List   Diagnosis Date Noted  . Encounter for counseling regarding contraception 03/19/2020    History reviewed. No pertinent surgical history.  OB History    Gravida  0   Para  0   Term  0   Preterm  0   AB  0   Living  0     SAB  0   TAB  0   Ectopic  0   Multiple  0   Live Births  0        Obstetric Comments  Has been having having periods since age 16.          Home Medications    Prior to Admission medications   Medication Sig Start Date End Date Taking? Authorizing Provider  ibuprofen (ADVIL) 400 MG tablet Take 1 tablet (400 mg total) by mouth every 6 (six) hours as needed. 05/13/20   Merrilee Jansky, MD  norgestimate-ethinyl estradiol (ORTHO-CYCLEN) 0.25-35 MG-MCG tablet Take 1 tablet by mouth daily. 03/19/20   Meccariello, Solmon Ice, DO  ondansetron (ZOFRAN) 4 MG tablet Take 1 tablet (4 mg total) by mouth every 6 (six) hours as needed for nausea or vomiting. 04/04/20   Mickie Bail, NP    Family History Family History  Problem Relation Age of Onset  . Hypertension Mother     Social History Social History    Tobacco Use  . Smoking status: Never Smoker  . Smokeless tobacco: Never Used  Vaping Use  . Vaping Use: Never used  Substance Use Topics  . Alcohol use: Never  . Drug use: Never     Allergies   Patient has no known allergies.   Review of Systems Review of Systems  Constitutional: Negative.   Respiratory: Negative for cough, shortness of breath and wheezing.   Cardiovascular: Positive for chest pain.  Gastrointestinal: Negative.   Skin: Negative.   Neurological: Negative.   Psychiatric/Behavioral: Negative for confusion.     Physical Exam Triage Vital Signs ED Triage Vitals  Enc Vitals Group     BP 05/13/20 1912 113/64     Pulse Rate 05/13/20 1912 82     Resp 05/13/20 1912 16     Temp 05/13/20 1912 98.5 F (36.9 C)     Temp Source 05/13/20 1912 Oral     SpO2 05/13/20 1912 100 %     Weight --      Height --      Head Circumference --  Peak Flow --      Pain Score 05/13/20 1914 5     Pain Loc --      Pain Edu? --      Excl. in GC? --    No data found.  Updated Vital Signs BP 113/64 (BP Location: Right Arm)   Pulse 82   Temp 98.5 F (36.9 C) (Oral)   Resp 16   SpO2 100%   Visual Acuity Right Eye Distance:   Left Eye Distance:   Bilateral Distance:    Right Eye Near:   Left Eye Near:    Bilateral Near:     Physical Exam Vitals and nursing note reviewed.  Cardiovascular:     Heart sounds: Normal heart sounds.     Comments: Tenderness on palpation over the right costochondral area.  Point tenderness in the subscapular area on the right side. Pulmonary:     Effort: Pulmonary effort is normal.     Breath sounds: Normal breath sounds. No decreased breath sounds, wheezing, rhonchi or rales.  Abdominal:     General: Bowel sounds are normal.  Neurological:     Mental Status: She is alert.      UC Treatments / Results  Labs (all labs ordered are listed, but only abnormal results are displayed) Labs Reviewed - No data to  display  EKG   Radiology No results found.  Procedures Procedures (including critical care time)  Medications Ordered in UC Medications - No data to display  Initial Impression / Assessment and Plan / UC Course  I have reviewed the triage vital signs and the nursing notes.  Pertinent labs & imaging results that were available during my care of the patient were reviewed by me and considered in my medical decision making (see chart for details).     1.  Chest wall pain: Ibuprofen as needed for pain Heating pad Deep breathing exercises Return precautions given. Final Clinical Impressions(s) / UC Diagnoses   Final diagnoses:  Chest wall pain   Discharge Instructions   None    ED Prescriptions    Medication Sig Dispense Auth. Provider   ibuprofen (ADVIL) 400 MG tablet Take 1 tablet (400 mg total) by mouth every 6 (six) hours as needed. 30 tablet Beckham Capistran, Britta Mccreedy, MD     PDMP not reviewed this encounter.   Merrilee Jansky, MD 05/13/20 2007

## 2020-05-13 NOTE — ED Triage Notes (Signed)
Pt present right side chest pain. Pt state that the pain is radiating from her chest to her back. Any movement on that right side is painful.

## 2020-05-26 NOTE — Progress Notes (Signed)
    SUBJECTIVE:   CHIEF COMPLAINT / HPI:   Sports physical Patient presents today for sports physical with her paperwork She denies any family history of known cardiac issues, no sudden cardiac death, no personal history of chest pain while working out, did go to the ED for costochondritis recently, states that the pain does not occur when she is active, only when pressing on her chest and improved with ibuprofen Denies any difficulty breathing with working out She plans to do track Does not have any personal history of heart problems or lung problems  Contraception Patient likes her birth control pill and presents today for refill on her medication. She has been using it regularly.   PERTINENT  PMH / PSH: Noncontributory  OBJECTIVE:   BP 108/64   Pulse 87   Ht 5\' 8"  (1.727 m)   Wt 114 lb (51.7 kg)   LMP 05/23/2020 (Exact Date)   SpO2 98%   BMI 17.33 kg/m    Physical Exam:  General: 19 y.o. female in NAD HEENT: EOMI, PERRL Neck: Supple, no thyromegaly palpated, full ROM Cardio: RRR no m/r/g Lungs: CTAB, no wheezing, no rhonchi, no crackles, no IWOB on RA Abdomen: Soft, non-tender to palpation, non-distended, positive bowel sounds Skin: warm and dry Extremities: No edema, able to duck walk without difficulty, strength and range of motion intact in bilateral upper extremities   ASSESSMENT/PLAN:   Sports physical 11 sports physical paperwork reviewed.  No red flags.  No history that requires further investigating for patient.  Her chest pain is likely secondary to costochondritis and has improved with ibuprofen, also only tender to palpation and does not need further work-up.  She is cleared for athletic participation was congratulated on moving forward with sports.  Birth control counseling Refill provided for her birth control pills.     [employer], DO [facility]

## 2020-05-27 ENCOUNTER — Ambulatory Visit (INDEPENDENT_AMBULATORY_CARE_PROVIDER_SITE_OTHER): Payer: Medicaid Other | Admitting: Family Medicine

## 2020-05-27 ENCOUNTER — Encounter: Payer: Self-pay | Admitting: Family Medicine

## 2020-05-27 ENCOUNTER — Other Ambulatory Visit: Payer: Self-pay

## 2020-05-27 VITALS — BP 108/64 | HR 87 | Ht 68.0 in | Wt 114.0 lb

## 2020-05-27 DIAGNOSIS — Z23 Encounter for immunization: Secondary | ICD-10-CM | POA: Diagnosis not present

## 2020-05-27 DIAGNOSIS — Z025 Encounter for examination for participation in sport: Secondary | ICD-10-CM | POA: Diagnosis not present

## 2020-05-27 DIAGNOSIS — Z3009 Encounter for other general counseling and advice on contraception: Secondary | ICD-10-CM | POA: Diagnosis not present

## 2020-05-27 LAB — POCT URINE PREGNANCY: Preg Test, Ur: NEGATIVE

## 2020-05-27 MED ORDER — NORGESTIMATE-ETH ESTRADIOL 0.25-35 MG-MCG PO TABS: 1.0000 | ORAL_TABLET | Freq: Every day | ORAL | 10 refills | Status: DC

## 2020-05-27 NOTE — Patient Instructions (Signed)
Thank you for coming to see me today. It was a pleasure. Today we talked about:   I have refilled your birth control.  Good luck with sports!   If you have any questions or concerns, please do not hesitate to call the office at [phone].  Best,   Luis Abed, DO

## 2020-05-27 NOTE — Addendum Note (Signed)
Addended by: Aquilla Solian on: 05/27/2020 11:33 AM   Modules accepted: Orders

## 2020-05-27 NOTE — Assessment & Plan Note (Signed)
[employer] [place] sports physical paperwork reviewed.  No red flags.  No history that requires further investigating for patient.  Her chest pain is likely secondary to costochondritis and has improved with ibuprofen, also only tender to palpation and does not need further work-up.  She is cleared for athletic participation was congratulated on moving forward with sports.

## 2020-05-27 NOTE — Assessment & Plan Note (Signed)
Refill provided for her birth control pills.

## 2020-07-08 ENCOUNTER — Ambulatory Visit (INDEPENDENT_AMBULATORY_CARE_PROVIDER_SITE_OTHER): Payer: Medicaid Other | Admitting: Family Medicine

## 2020-07-08 ENCOUNTER — Other Ambulatory Visit: Payer: Self-pay

## 2020-07-08 ENCOUNTER — Encounter: Payer: Self-pay | Admitting: Family Medicine

## 2020-07-08 VITALS — BP 112/62 | HR 95 | Ht 67.0 in | Wt 112.2 lb

## 2020-07-08 DIAGNOSIS — D649 Anemia, unspecified: Secondary | ICD-10-CM | POA: Diagnosis not present

## 2020-07-08 DIAGNOSIS — R0781 Pleurodynia: Secondary | ICD-10-CM | POA: Diagnosis not present

## 2020-07-08 DIAGNOSIS — F419 Anxiety disorder, unspecified: Secondary | ICD-10-CM

## 2020-07-08 NOTE — Progress Notes (Signed)
    SUBJECTIVE:   CHIEF COMPLAINT / HPI:   Chest wall pain: no longer having chest pain which previously brought her to the ED.  She is now having pain in her ribs bilaterally with inspiration. This has been going on for three days.  No cough.  No sick contacts.  No fevers.  she Hasn't been exercising recently.  She will be on the track team at [employer] in the spring. .    Leg pain:  The pain 'Switches sides'.  Currently it is on the left leg.She has had issues with leg pain before.  Sometimes the pain lasts an hour,and can occur  one to two times a day.  She takes ibuprofen and tylenol, which helps.  Pain occurs at random times.  The pain travels below the knee.  She describes it as a sharp stabbing pain. No family hx of sickle cell or other blood disordersm but she Was told she had anemia in [place].     PERTINENT  PMH / PSH:  Patient currently living with a friend.  She states her family 'threw her away'.  Her older sister is currently asking her to get tested by a doctor to see if she is still a virgin.  She would like [place] to call her sister and explain to her that we do not do that kind of testing.   Magbol Hu sister.  [phone]  OBJECTIVE:   BP 112/62   Pulse 95   Ht 5\' 7"  (1.702 m)   Wt 112 lb 3.2 oz (50.9 kg)   LMP 06/20/2020   SpO2 99%   BMI 17.57 kg/m   Gen: alert, oriented.  Thin young woman. No acute distress.   CV: RRR.  nontender to palpation. No murmurs Chest: no TTP along the ribs.   Pulm: lCTAB Msk: lower extremities nontender to palpation.  No swelling or asymmetry noted.    ASSESSMENT/PLAN:   Anxiety Pt's anxiety appears to be playing a role in her somatic complaints.  Her chest wall pain has resolved and now she has bilateral rib (not diaphragmatic) pain with inspiration, but is nontender to palpation.  Also having leg pain that switches from one leg to the other, again with normal physical exam.  Considered sickle cell as a potential cause of her  pain, but pt denies a personal or family history of sickle cell disease.  Will get cbc given her self reported history of anemia. Will get CXR to help rule out TB or other lung disease as cause of her pleuritic pain. Anxiety seems to be worsened by the situation between her and her family, whom the patient feels like abandoned her. Advised pt to speak with a therapist and follow up with her pcp.       06/22/2020, MD [facility]

## 2020-07-08 NOTE — Progress Notes (Deleted)
Magbol Beauregard sister.  [phone]

## 2020-07-08 NOTE — Patient Instructions (Addendum)
It was nice to meet you today,  I will get a CBC and order a chest x-ray.  You can get the x-ray done at the [facility] imaging location on [address].  You can go in during business hours and get the chest x-ray at any time.  I will call you with results when I get them.  Below I have included a list of therapists.  I would encourage you to reach out to one of them.  I would like you to follow-up with your PCP, Dr. Obie Dredge, regarding your anxiety.  Have a great day,  Frederic Jericho, MD  Therapy and Counseling Resources Most providers on this list will take Medicaid. Patients with commercial insurance or Medicare should contact their insurance company to get a list of in network providers.  [facility] [address]  [place], [place] [address]       [phone]     [facility] [address] [place], [place]  [phone]    [facility] [address] [place], [place] [phone]  [facility]:  [address] [place] [phone]  [facility]:  [address] [place], [place] [phone]  [facility] (under & uninsured) [address]   [place] [phone]    [email or web address]    [facility] [place] -[address]     Phone:  [phone]     [facility]-  [address]  [phone]   [facility] [address]      [place], [place] [phone]  [facility]: [address] [place], [place], [place]  [phone]   [facility] (Spanish therapist) [address] [place]  [address]   [place] [address]    [phone]    [facility]   [address],  [place], [place]  [phone]   [facility]  [address] [place]  [phone]  [facility]  [address] [place], [place]         [phone]  [facility] under & uninsured [place],  [address] [place] [phone]):  Mon - Fri from 8 AM - 3 PM  [facility] [address],  (Spanish)   [address], [place]: [phone]) 8:30 - 12; 1 - 2:30  [facility] [employer],  [address] [place], [place]    ([phone]):8:30 - 12; 2 - 3PM  [facility] [employer],  [address],  [place]; [phone]):   Mon - Fri 8 AM - 5 PM  [facility] [address] [place]  MWF 12:30 to 3:00 or call to schedule an appointment  [phone]  Specific Provider options Psychology Today  [email or web address].com/us 1. click on find a therapist  2. enter your zip code 3. left side and select or tailor a therapist for your specific need.   [facility] Provider Directory [email or web address]  (Medicaid)   Follow all drop down to find a provider  Social Support program Mental Health [employer] [phone] or [employer] [address], [place], [place] Recovery support and educational   In home counseling [facility] Telephone: [phone]  office in [place] [email or web address]   Does not take reg. Medicaid or Medicare private insurance [facility], [place] health Choice, [place], [place], [place],  [place], [place] Health Choice  24- Hour [facility]   [phone] or [phone]   [facility] the [facility] [phone]  [facility] Crisis Service  [phone]    [facility]  [phone] (after hours)   Therapeutic Alternative/Mobile Crisis   [phone]   [place] National Suicide Hotline  [phone] [address])   Call 911 or go to emergency room   [facility]  [phone]);  [place] and [employer]  [phone]); [place], [place], [place], [place],  [place], [place], [place]

## 2020-07-09 DIAGNOSIS — F419 Anxiety disorder, unspecified: Secondary | ICD-10-CM | POA: Insufficient documentation

## 2020-07-09 DIAGNOSIS — F411 Generalized anxiety disorder: Secondary | ICD-10-CM | POA: Insufficient documentation

## 2020-07-09 LAB — CBC
Hematocrit: 37.3 % (ref 34.0–46.6)
Hemoglobin: 12.2 g/dL (ref 11.1–15.9)
MCH: 27.7 pg (ref 26.6–33.0)
MCHC: 32.7 g/dL (ref 31.5–35.7)
MCV: 85 fL (ref 79–97)
Platelets: 241 10*3/uL (ref 150–450)
RBC: 4.4 x10E6/uL (ref 3.77–5.28)
RDW: 13.3 % (ref 11.7–15.4)
WBC: 5.6 10*3/uL (ref 3.4–10.8)

## 2020-07-09 NOTE — Assessment & Plan Note (Signed)
Pt's anxiety appears to be playing a role in her somatic complaints.  Her chest wall pain has resolved and now she has bilateral rib (not diaphragmatic) pain with inspiration, but is nontender to palpation.  Also having leg pain that switches from one leg to the other, again with normal physical exam.  Considered sickle cell as a potential cause of her pain, but pt denies a personal or family history of sickle cell disease.  Will get cbc given her self reported history of anemia. Will get CXR to help rule out TB or other lung disease as cause of her pleuritic pain. Anxiety seems to be worsened by the situation between her and her family, whom the patient feels like abandoned her. Advised pt to speak with a therapist and follow up with her pcp.

## 2020-07-10 ENCOUNTER — Telehealth: Payer: Self-pay | Admitting: Family Medicine

## 2020-07-10 NOTE — Telephone Encounter (Signed)
Called pt's sister, per pt's request.  No answer.  Left voicemail asking sister to return call to office.

## 2020-09-02 ENCOUNTER — Other Ambulatory Visit: Payer: Self-pay

## 2020-09-02 ENCOUNTER — Encounter: Payer: Self-pay | Admitting: Family Medicine

## 2020-09-02 ENCOUNTER — Ambulatory Visit (INDEPENDENT_AMBULATORY_CARE_PROVIDER_SITE_OTHER): Payer: Medicaid Other | Admitting: Family Medicine

## 2020-09-02 VITALS — BP 110/70 | HR 91 | Ht 67.0 in | Wt 111.0 lb

## 2020-09-02 DIAGNOSIS — F411 Generalized anxiety disorder: Secondary | ICD-10-CM | POA: Diagnosis not present

## 2020-09-02 MED ORDER — HYDROXYZINE HCL 10 MG PO TABS: 10.0000 mg | ORAL_TABLET | Freq: Three times a day (TID) | ORAL | 0 refills | Status: DC | PRN

## 2020-09-02 NOTE — Assessment & Plan Note (Signed)
No formal diagnosis of anxiety.  This seems to be more as a reaction to the stress is happening in her family.  Has only had the symptoms for the last 3 days where she has difficulty thinking and coping.  Discussed with patient that would likely not start her on a controller medicine at this time given the short timeframe.  Discussed deep breathing techniques to help with coping.  Also discussed trialing hydroxyzine 10 mg 3 times daily as needed for anxiety, can also time 1 of these before sleep.  She is open to therapy, therefore given list of therapy resources.  Also ensured that she knew the suicide hotline number.  She will follow-up in 2 weeks.  At that point, could consider putting her on a controller.

## 2020-09-02 NOTE — Patient Instructions (Addendum)
Thank you for coming to see me today. It was a pleasure. Today we talked about:   We will start you on medicine to use as needed for when you feel anxious.  You can also try this at bedtime.  Don't take more often than every 8 hours.  See the list of therapists below.  Please follow-up with me in 2 weeks.  If you have any questions or concerns, please do not hesitate to call the office at [phone].  Best,   Luis Abed, DO   Therapy and Counseling Resources Most providers on this list will take Medicaid. Patients with commercial insurance or Medicare should contact their insurance company to get a list of in network providers.  BestDay:Psychiatry and Counseling [address] [facility] [address] [place], [place] [address] [phone]  [facility]  [address], Suite [place], [place] [address]      [phone]  [facility] [address]  [place], [place] [address] [phone]  [facility] [address]  [place], [place] [address]       [phone]      Jovita Kussmaul [facility] [address], [place], [place] [phone]  [facility]:  [address] [place] [phone]  [facility]:  [address] Hessie Diener [phone]  [facility] (under & uninsured) [address]   [place] [phone]    [email or web address]    [facility] [address] . [place]    [phone]  [facility] -[address]     Phone:  [phone]     [facility]-  [address]  [phone]   [facility] [address]      [place], [place] [phone]  [facility]: [address] [place], [place], [place]  [phone]   [facility] (Spanish therapist) [email or web address]  [address] [place]  [address]    [place] [address]    [phone]    [facility]   [address],  [place], [place]  [phone]   [facility]  [address] [place]  [phone]  [facility]  [address] [place], [place]        [phone]  Open Access/Walk In Clinic under & uninsured  [facility]  [address] [place], [place] [phone] Crisis [phone]  [facility] [place],  (Spanish)   [address], [place]: [phone]) 8:30 - 12; 1 - 2:30  [facility] [employer],  [address] [place], [place]    [phone]):8:30 - 12; 2 - 3PM  [facility] [employer],  [address],  [place]; [phone]):   Mon - Fri 8 AM - 5 PM  [facility] [address] [place]  MWF 12:30 to 3:00 or call to schedule an appointment  [phone]  Specific Provider options Psychology Today  [email or web address] 1. click on find a therapist  2. enter your zip code 3. left side and select or tailor a therapist for your specific need.   [facility] Provider Directory [email or web address]  (Medicaid)   Follow all drop down to find a provider  Social Support program Mental Health [place] [phone] or PhotoSolver.pl [address], [place], [place] Recovery  support and educational   24- Hour Availability:  .  Marland Kitchen [facility]  . [address] [place], [place] [employer] [phone] Crisis [phone]  . [facility] [phone]  [facility] Service  [phone]   . [facility] [employer]  [phone] (after hours)  . Therapeutic Alternative/Mobile Crisis   [phone]  . [place] National Suicide Hotline  [phone] (TALK)  . Call 911 or go to emergency room  . [employer]  [phone]);  [place] and [employer]    . [facility] ACCESS  [phone]); Georgina Quint, [place], [place], [place], [place], [place]   Dental list         Updated 11.20.18 These dentists all accept Medicaid.  The list is a courtesy and for your convenience. Estos dentistas aceptan Medicaid.  La lista es para su [place] y es una cortesa.     [facility]     [phone] [address] [place] [address] Se habla espaol From 57 to 29 years old Parent may go with child only for cleaning Vinson Moselle DDS     [phone] Milus Banister, DDS (Spanish speaking) [address] [place]  [address] Se habla espaol From 33 to 51 years old Parent may go with child   Marolyn Hammock DMD    [phone] [address] [place] [address] Se habla espaol [place] spoken From 60 years old Parent may go with child [facility]     [phone] [address] [place] [address] Se habla espaol From 33 to 49 years old Parent may NOT go with child  Winfield Rast DDS  [phone] [facility]      [address]  [place] [address] Se habla espaol [place] spoken (preferred to bring translator) From teeth coming in to 49 years old Parent may go with child  [facility]     [phone] [address] [place]. [employer] [place] [address] Requires certification. Call for information. Requiere certificacin. Llame para informacin. Algunos dias se habla espaol  From birth to 20 years Parent possibly goes with child   Bradd Canary DDS     [phone] [address] [employer].  [address] [place] [address] Se habla espaol From 18 months to 18 years  Parent may go with child  J. [facility] DDS     Garlon Hatchet DDS  [phone] [address] [place] [address] Se habla espaol From 82 year old Parent may go with child   Melynda Ripple DDS    [phone] [place] [address] Se habla  espaol  From 18 months to 37 years old Parent may go with child Dorian Pod DDS    [phone] [address] [place] [address] Se habla espaol From 21 to 49 years old Parent may go with child  [facility]    [phone] [address] [place] [address] No se Wayne Sever [facility]  [phone] [address] [place] [address] Se habla espanol Interpretation for other languages Special needs children welcome  Geryl Councilman, DDS PA     [phone]  [place], [place] [address] From 21 years old   Special needs children welcome  [facility]   [phone] Dr. Orlean Patten [address] [place], [place] [address] Se habla espaol From birth to 12 years Special needs children welcome  [place] [phone] [place], [place] [address]   [place] [phone] [place], [place] [address]

## 2020-09-02 NOTE — Progress Notes (Signed)
    SUBJECTIVE:   CHIEF COMPLAINT / HPI:   Stress 3 days has been very stressed, down Has been having problems with family and hasn't been living with them for 2 months Now living with her boyfriend and his family, gets along with them Also very stressed because she is graduating this year and needs to plan a lot of things Finds her mind facing and struggling to sleep Denies SI  Flowsheet Row Office Visit from 09/02/2020 in [facility]  PHQ-9 Total Score 13     GAD 7 : Generalized Anxiety Score 09/02/2020 07/08/2020  Nervous, Anxious, on Edge 2 1  Control/stop worrying 2 2  Worry too much - different things 2 3  Trouble relaxing 3 2  Restless 2 0  Easily annoyed or irritable 1 0  Afraid - awful might happen 2 3  Total GAD 7 Score 14 11  Anxiety Difficulty Somewhat difficult -      PERTINENT  PMH / PSH: Possible anxiety, refugee  OBJECTIVE:   BP 110/70   Pulse 91   Ht 5\' 7"  (1.702 m)   Wt 111 lb (50.3 kg)   SpO2 99%   BMI 17.39 kg/m    Physical Exam:  General: 20 y.o. female in NAD Lungs: Breathing comfortably on RA Skin: warm and dry Psych: Mood and affect appropriate for circumstance, does get slightly tearful when speaking about her family situation, thought process linear logical, good insight, no SI   ASSESSMENT/PLAN:   Anxiety state No formal diagnosis of anxiety.  This seems to be more as a reaction to the stress is happening in her family.  Has only had the symptoms for the last 3 days where she has difficulty thinking and coping.  Discussed with patient that would likely not start her on a controller medicine at this time given the short timeframe.  Discussed deep breathing techniques to help with coping.  Also discussed trialing hydroxyzine 10 mg 3 times daily as needed for anxiety, can also time 1 of these before sleep.  She is open to therapy, therefore given list of therapy resources.  Also ensured that she knew the suicide hotline  number.  She will follow-up in 2 weeks.  At that point, could consider putting her on a controller.     26, DO [facility] Center

## 2020-09-18 ENCOUNTER — Other Ambulatory Visit: Payer: Self-pay

## 2020-09-18 ENCOUNTER — Ambulatory Visit (INDEPENDENT_AMBULATORY_CARE_PROVIDER_SITE_OTHER): Payer: Medicaid Other | Admitting: Family Medicine

## 2020-09-18 ENCOUNTER — Encounter: Payer: Self-pay | Admitting: Family Medicine

## 2020-09-18 VITALS — BP 102/68 | HR 79 | Ht 67.0 in | Wt 111.2 lb

## 2020-09-18 DIAGNOSIS — L700 Acne vulgaris: Secondary | ICD-10-CM | POA: Insufficient documentation

## 2020-09-18 DIAGNOSIS — F411 Generalized anxiety disorder: Secondary | ICD-10-CM | POA: Diagnosis not present

## 2020-09-18 DIAGNOSIS — Z3009 Encounter for other general counseling and advice on contraception: Secondary | ICD-10-CM

## 2020-09-18 MED ORDER — NORGESTIMATE-ETH ESTRADIOL 0.25-35 MG-MCG PO TABS: 1.0000 | ORAL_TABLET | Freq: Every day | ORAL | 10 refills | Status: DC

## 2020-09-18 MED ORDER — CLINDAMYCIN PHOS-BENZOYL PEROX 1.2-5 % EX GEL: 1.0000 "application " | Freq: Two times a day (BID) | CUTANEOUS | 0 refills | Status: DC

## 2020-09-18 NOTE — Assessment & Plan Note (Signed)
No unprotected intercourse in the last 2 weeks, LMP 2/3.  No concern for pregnancy.  OCPs refilled at patient request.

## 2020-09-18 NOTE — Progress Notes (Signed)
    SUBJECTIVE:   CHIEF COMPLAINT / HPI:   Follow-up anxiety state Last seen on 09/02/2020 At that time she was very stressed with current situation with her family Started on hydroxyzine 10 mg 3 times daily as needed and advised to look into therapy She has not yet called a therapist She reports that she has had some improvement in her mood since she was seen last, but still has a strained relationship with her family She is currently living with her boyfriend and his family She denies any SI She has been taking hydroxyzine minimally, but is taking at night and feels that it helps her sleep She feels that her symptoms are worse at night when she tries to sleep States that overall she has been feeling this way for about a month  Birth control refill Patient has been taking OCPs and likes them, but went to get more from the pharmacy and was told that she needed to talk with her doctor She denies any unprotected intercourse in the last 2 weeks She denies any problems with her birth control pills Patient's last menstrual period was 09/10/2020 (exact date).  Acne  Just washes with water now Has mostly on her forehead is wanting a prescription for this  PERTINENT  PMH / PSH: Anxiety state  OBJECTIVE:   BP 102/68   Pulse 79   Ht 5\' 7"  (1.702 m)   Wt 111 lb 3.2 oz (50.4 kg)   LMP 09/10/2020 (Exact Date)   SpO2 100%   BMI 17.42 kg/m    Physical Exam:  General: 20 y.o. female in NAD Lungs: Breathing comfortably on room air Skin: warm and dry, multiple papules and pustules on forehead consistent with acne Psych: Mood and affect appropriate for circumstance, thought process linear and logical, good insight, no SI   ASSESSMENT/PLAN:   Anxiety state Slowly improving, denies SI.  Encouraged her to consider a therapist.  Offered her [facility] therapy, but she declines.  She would like to look into this on her own.  She would like to continue hydroxyzine for now.  Given that we  are still in the very acute period, would not start her on a controller at present.  Can keep this in mind for the future.  We will follow-up with her in 2 weeks.  Birth control counseling No unprotected intercourse in the last 2 weeks, LMP 2/3.  No concern for pregnancy.  OCPs refilled at patient request.  Acne vulgaris We will start with clindamycin-benzoyl peroxide gel, advised her to use once nightly for now, but can increase to twice daily if needed.  Advised her that this can cause some irritation, so if causes irritation which is using nightly, can decrease to every other day or even further from there.  Return to care if no improvement.     26, DO [facility]

## 2020-09-18 NOTE — Assessment & Plan Note (Signed)
Slowly improving, denies SI.  Encouraged her to consider a therapist.  Offered her [facility] refugee therapy, but she declines.  She would like to look into this on her own.  She would like to continue hydroxyzine for now.  Given that we are still in the very acute period, would not start her on a controller at present.  Can keep this in mind for the future.  We will follow-up with her in 2 weeks.

## 2020-09-18 NOTE — Assessment & Plan Note (Signed)
We will start with clindamycin-benzoyl peroxide gel, advised her to use once nightly for now, but can increase to twice daily if needed.  Advised her that this can cause some irritation, so if causes irritation which is using nightly, can decrease to every other day or even further from there.  Return to care if no improvement.

## 2020-09-18 NOTE — Patient Instructions (Signed)
Thank you for coming to see me today. It was a pleasure. Today we talked about:   Please call the therapist listed below.  I have refilled your birth control.  I have sent an acne cream that you can use, start with once daily at night.  If you get irritation, decrease to every other day or even less.  You can also get an over-the-counter face wash.  Ones with salicylic acid or benzoyl peroxide are usually the most effective.  There is benzoyl peroxide and you are gel, therefore do not use too much at one time.   Please follow-up with me in 2 weeks.  If you have any questions or concerns, please do not hesitate to call the office at [phone].  Best,   Luis Abed, DO   Therapy and Counseling Resources Most providers on this list will take Medicaid. Patients with commercial insurance or Medicare should contact their insurance company to get a list of in network providers.  BestDay:Psychiatry and Counseling [address] [facility] [address] [place], [place] [address] [phone]  [facility]  [address], Suite [place], [place] [address]      [phone]  [facility] [address]  [place], [place] [address] [phone]  [facility] [address]  [place], [place] [address]       [phone]      Jovita Kussmaul [facility] [address] [place], [place] [phone]  [facility]:  [address] [place] [phone]  [facility]:  [address] [place]iener [phone]  [facility] (under & uninsured) [address]   [place] [phone]    [email or web address]    [facility] [address] [place]    [phone]  [facility] -[address]     Phone:  [phone]     [facility]-  [address]  [phone]   [facility]1 [address]      [place], [place] [phone]  [facility]: [address] [place], [place], [place]  [phone]   [facility] (Spanish therapist) [email or web address]  [address] [place]  [address]   [place] [address]    [phone]    [facility]   [address],  [place], [place]  [phone]   [facility]  [address] [place]  [phone]  [facility]  [address] [place], [place]        [phone]  Open Access/Walk In Clinic under & uninsured  [facility]  [address] [place], [place] [phone] Crisis [phone]  [facility],  (Spanish)   [address] [place], [place]: [phone]) 8:30 - 12; 1 - 2:30  [facility] [employer],  [address] [place], [place]    ([phone]):8:30 - 12; 2 - 3PM  [facility] [employer],  [address],  [place]; [phone]):   Mon - Fri 8 AM - 5 PM  [facility] [address] [place]  MWF 12:30 to 3:00 or call to schedule an appointment  [phone]  Specific Provider options Psychology Today  [email or web address] 1. click on find a therapist  2. enter your zip code 3. left side and select  or tailor a therapist for your specific need.   [facility] Provider Directory [email or web address]  (Medicaid)   Follow all drop down to find a provider  Social Support program Mental Health [place] [phone] or PhotoSolver.pl [address], [place], [place] Recovery support and educational   24- Hour Availability:  .  Marland Kitchen [facility]  . [address] [place], [place] [employer] [phone] Crisis [phone]  . [facility] [phone]  [facility] Crisis Service  [phone]   . [facility] [employer]   [phone] (after hours)  . Therapeutic Alternative/Mobile Crisis   [phone]  . [place] National Suicide Hotline  [phone] (TALK)  . Call 911 or go to emergency room  . [employer]  [phone]);  [place] and [employer]   . [facility] ACCESS  [phone]); [place], [place], [place], [place], [place], [place], [place]

## 2020-09-29 NOTE — Progress Notes (Signed)
    SUBJECTIVE:   CHIEF COMPLAINT / HPI:   Anxiety state follow-up Last seen on 09/18/2020 She has been on hydroxyzine 10 mg 3 times daily Hasn't been taking for the last two days Having improvement in her anxiety and how she is handling things Her boyfriend is very supportive She is still not getting along with her family, but feels better about it Therapy resources given, but hasn't called, thinks that she is doing well   Flowsheet Row Office Visit from 09/30/2020 in [facility]  PHQ-9 Total Score 6     GAD 7 : Generalized Anxiety Score 09/30/2020 09/18/2020 09/02/2020 07/08/2020  Nervous, Anxious, on Edge 1 2 2 1   Control/stop worrying 1 1 2 2   Worry too much - different things 2 3 2 3   Trouble relaxing 0 2 3 2   Restless 0 1 2 0  Easily annoyed or irritable 1 2 1  0  Afraid - awful might happen 2 3 2 3   Total GAD 7 Score 7 14 14 11   Anxiety Difficulty - - Somewhat difficult -      PERTINENT  PMH / PSH: Anxiety state  OBJECTIVE:   BP 100/60   Pulse 91   Ht 5\' 7"  (1.702 m)   Wt 111 lb 6 oz (50.5 kg)   LMP 09/10/2020 (Exact Date)   SpO2 99%   BMI 17.44 kg/m    Physical Exam:  General: 20 y.o. female in NAD Lungs: Breathing comfortably on room air Skin: warm and dry Psych: Mood and affect appropriate for circumstance, thought process linear logical, good insight, no SI   ASSESSMENT/PLAN:   Anxiety state Improving.  Patient states overall she is doing well.  Again declines therapy.  No SI.  She states that she thinks she is okay to go for weeks until her next appointment.  She knows that she has good support and that her boyfriend is there for her.  She was again given therapy resources and suicide hotline number if needed.  She will follow-up in 1 month.  She can continue to use hydroxyzine as needed.  Acne vulgaris Refill BenzaClin at patient request, she is doing well with this.     , DO [facility] Medicine  Center

## 2020-09-30 ENCOUNTER — Ambulatory Visit (INDEPENDENT_AMBULATORY_CARE_PROVIDER_SITE_OTHER): Payer: Medicaid Other | Admitting: Family Medicine

## 2020-09-30 ENCOUNTER — Other Ambulatory Visit: Payer: Self-pay

## 2020-09-30 ENCOUNTER — Encounter: Payer: Self-pay | Admitting: Family Medicine

## 2020-09-30 VITALS — BP 100/60 | HR 91 | Ht 67.0 in | Wt 111.4 lb

## 2020-09-30 DIAGNOSIS — L700 Acne vulgaris: Secondary | ICD-10-CM

## 2020-09-30 DIAGNOSIS — F411 Generalized anxiety disorder: Secondary | ICD-10-CM | POA: Diagnosis not present

## 2020-09-30 MED ORDER — CLINDAMYCIN PHOS-BENZOYL PEROX 1.2-5 % EX GEL: 1.0000 "application " | Freq: Two times a day (BID) | CUTANEOUS | 2 refills | Status: DC

## 2020-09-30 NOTE — Assessment & Plan Note (Signed)
Improving.  Patient states overall she is doing well.  Again declines therapy.  No SI.  She states that she thinks she is okay to go for weeks until her next appointment.  She knows that she has good support and that her boyfriend is there for her.  She was again given therapy resources and suicide hotline number if needed.  She will follow-up in 1 month.  She can continue to use hydroxyzine as needed.

## 2020-09-30 NOTE — Assessment & Plan Note (Signed)
Refill BenzaClin at patient request, she is doing well with this.

## 2020-09-30 NOTE — Patient Instructions (Signed)
Thank you for coming to see me today. It was a pleasure. Today we talked about:   I'm glad that you are doing better.  If you need the therapy list, you can call them below.  Please follow-up with me in 1 month.  If you have any questions or concerns, please do not hesitate to call the office at [phone].  Best,   Luis Abed, DO   Therapy and Counseling Resources Most providers on this list will take Medicaid. Patients with commercial insurance or Medicare should contact their insurance company to get a list of in network providers.  BestDay:Psychiatry and Counseling [address] [place], [place] [address] [phone]  [facility]  [address] Suite [place], [place] [address]      [phone]  [facility] [address]  [place], [place] [address] [phone]  [facility] [address]  [place], [place] [address]       [phone]      [facility] [address], [place], [place] [phone]  [facility]:  [address] [place] [phone]  [facility]:  [address] [place] [place]ien[place] [phone]  [facility] (under & uninsured) [address]   [place] [phone]    [email or web address]    [facility] [address] . [place]    [phone]  [facility] -[address]     Phone:  [phone]     [facility]  [address]  [phone]   [facility] [address]      [place], [place] [phone]  [facility]: [address] [place], [place], [place]  [phone]   [facility] (Spanish therapist) [email or web address]  [address] [place]  [address]   [place] [address]    [phone]    [facility]   [address],   [place], [place]  [phone]   [facility]  [address] [place]  [phone]  [facility]  [address] [place], [place]        [phone]  Open Access/Walk In Clinic under & uninsured  [facility]  [address] [place], [place] [phone] Crisis [phone]  [facility] [place],  (Spanish)   [address], [place]: [phone]) 8:30 - 12; 1 - 2:30  [facility] [employer],  [address] [place], [place]    ([phone]):8:30 - 12; 2 - 3PM  [facility] [employer],  [address]  [place]; [phone]):   Mon - Fri 8 AM - 5 PM  [facility] [address] [place]  MWF 12:30 to 3:00 or call to schedule an appointment  [phone]  Specific Provider options Psychology Today  [email or web address] 1. click on find a therapist  2. enter your zip code 3. left side and select or tailor a therapist for your specific need.   [facility] Provider Directory [email or web address]  (Medicaid)   Follow all drop down to find a provider  Social Support program [facility] [place] [phone] or PhotoS[employer].pl [address], [place], [place] Recovery support and educational   24- Hour Availability:  .  [facility]  . [address] [place], [place] [phone] Crisis [phone]  . [facility] [phone]  [facility]  [phone]   . [facility] [employer]  [phone] (after hours)  . Therapeutic Alternative/Mobile Crisis   [phone]  . [place] National Suicide Hotline  (830)[phone] (TALK)  . Call 911 or go to emergency room  . [employer]  [phone]);  [place] and [employer]   . [facility]  [phone]); [place], [place], [place], [place], [place],  [place], [place]

## 2020-10-26 NOTE — Progress Notes (Deleted)
    SUBJECTIVE:   CHIEF COMPLAINT / HPI:   Anxiety state follow-up Last seen on 09/30/2020 ***  PERTINENT  PMH / PSH: ***  OBJECTIVE:   There were no vitals taken for this visit.  ***  ASSESSMENT/PLAN:   No problem-specific Assessment & Plan notes found for this encounter.     Unknown Jim, DO [facility]   {    This will disappear when note is signed, click to select method of visit    :1}

## 2020-10-28 ENCOUNTER — Ambulatory Visit: Payer: Medicaid Other | Admitting: Family Medicine

## 2020-11-10 NOTE — Progress Notes (Signed)
    SUBJECTIVE:   CHIEF COMPLAINT / HPI:   Anxiety state follow-up Last seen on 09/30/2020 Doing better Things are getting better with family Takes hydroxyzine sparingly, not at all this week Sleeping okay Still living with boyfriend No SI  Bilateral Knee Pain Hurts up into her thighs and lower legs Not every day Sometimes every 2-3 days Wakes her up from sleep, mostly when more active during the day No pain with stairs Pain when goes from sitting to standing Has tried ibuprofen for the pain which helps No history of injury  PERTINENT  PMH / PSH: Anxiety state, acne  OBJECTIVE:   BP 100/60   Pulse 83   Ht 5' 7.5" (1.715 m)   Wt 112 lb 12.8 oz (51.2 kg)   LMP 10/08/2020 (Exact Date)   SpO2 100%   BMI 17.41 kg/m    Physical Exam:  General: 20 y.o. female in NAD Lungs: Breathing comfortably on room air Skin: warm and dry Psych: Mood and affect appropriate for circumstance, appropriate dress, thought process linear and logical, good insight, no SI  Bilateral Knee: - Inspection: no gross deformity. No swelling/effusion, erythema or bruising. Skin intact b/l - Palpation: no TTP b/l - ROM: full active ROM with flexion and extension in knee and hip b/l - Strength: 5/5 strength b/l in all fields except 4/5 strength b/l hip abduction - Neuro/vasc: NV intact distally b/l - Special Tests: - LIGAMENTS: negative anterior and posterior drawer, negative Lachman's, no MCL or LCL laxity b/l -- MENISCUS: negative McMurray's b/l -- PF JOINT: nml patellar mobility bilaterally.  negative patellar grind, negative patellar apprehension b/l  Hips: normal ROM, negative FABER and FADIR bilaterally    ASSESSMENT/PLAN:   Anxiety state Improving.  Doing well with hydroxyzine as needed and using sparingly.  No SI.  We will have her follow-up in 2 months.  No changes at this time.  Patellofemoral pain syndrome of both knees No prior injuries.  Knee exam is very reassuring.  Discussed  with her that her symptoms and hip abductor weakness are consistent with patellofemoral pain syndrome.  Discussed that mainstay of treatment would be physical therapy at this time.  She is agreeable to this, referral placed.  She can continue ibuprofen as needed for pain.  If she does not have improvement, would consider x-rays at that time.  Follow-up in 2 months.     Unknown Jim, DO [facility]

## 2020-11-11 ENCOUNTER — Other Ambulatory Visit: Payer: Self-pay

## 2020-11-11 ENCOUNTER — Encounter: Payer: Self-pay | Admitting: Family Medicine

## 2020-11-11 ENCOUNTER — Ambulatory Visit (INDEPENDENT_AMBULATORY_CARE_PROVIDER_SITE_OTHER): Payer: Medicaid Other | Admitting: Family Medicine

## 2020-11-11 VITALS — BP 100/60 | HR 83 | Ht 67.5 in | Wt 112.8 lb

## 2020-11-11 DIAGNOSIS — M222X1 Patellofemoral disorders, right knee: Secondary | ICD-10-CM | POA: Diagnosis not present

## 2020-11-11 DIAGNOSIS — M222X2 Patellofemoral disorders, left knee: Secondary | ICD-10-CM

## 2020-11-11 DIAGNOSIS — F411 Generalized anxiety disorder: Secondary | ICD-10-CM | POA: Diagnosis not present

## 2020-11-11 NOTE — Assessment & Plan Note (Signed)
Improving.  Doing well with hydroxyzine as needed and using sparingly.  No SI.  We will have her follow-up in 2 months.  No changes at this time.

## 2020-11-11 NOTE — Patient Instructions (Signed)
Thank you for coming to see me today. It was a pleasure. Today we talked about:   I have placed a referral to Physical Therapy for your knee pain.  If you do not hear from them in the next 2 weeks, please give [place] a call.  Please follow-up with me in 2 months.  If you have any questions or concerns, please do not hesitate to call the office at [phone].  Best,   Luis Abed, DO

## 2020-11-11 NOTE — Assessment & Plan Note (Signed)
No prior injuries.  Knee exam is very reassuring.  Discussed with her that her symptoms and hip abductor weakness are consistent with patellofemoral pain syndrome.  Discussed that mainstay of treatment would be physical therapy at this time.  She is agreeable to this, referral placed.  She can continue ibuprofen as needed for pain.  If she does not have improvement, would consider x-rays at that time.  Follow-up in 2 months.

## 2020-12-03 ENCOUNTER — Ambulatory Visit: Payer: Medicaid Other | Attending: Family Medicine | Admitting: Physical Therapy

## 2020-12-03 ENCOUNTER — Other Ambulatory Visit: Payer: Self-pay

## 2020-12-03 DIAGNOSIS — M79604 Pain in right leg: Secondary | ICD-10-CM | POA: Insufficient documentation

## 2020-12-03 DIAGNOSIS — R293 Abnormal posture: Secondary | ICD-10-CM | POA: Insufficient documentation

## 2020-12-03 DIAGNOSIS — M6281 Muscle weakness (generalized): Secondary | ICD-10-CM | POA: Diagnosis not present

## 2020-12-03 DIAGNOSIS — R2689 Other abnormalities of gait and mobility: Secondary | ICD-10-CM | POA: Insufficient documentation

## 2020-12-03 DIAGNOSIS — M79605 Pain in left leg: Secondary | ICD-10-CM | POA: Insufficient documentation

## 2020-12-03 DIAGNOSIS — M357 Hypermobility syndrome: Secondary | ICD-10-CM | POA: Diagnosis not present

## 2020-12-03 NOTE — Therapy (Addendum)
[facility] [address] [place], [place], [address] Phone: [phone]   Fax:  [phone]  Physical Therapy Evaluation  Patient Details  Name: Melanie Maddox MRN: [id number] Date of Birth: 07-Nov-2000 Referring Provider (PT): Westley Chandler, MD   Encounter Date: 12/03/2020   PT End of Session - 12/03/20 1356    Visit Number 1    Number of Visits 7    Date for PT Re-Evaluation 01/14/21    Authorization Type Medicaid [employer]    PT Start Time 1405    PT Stop Time 1445    PT Time Calculation (min) 40 min    Activity Tolerance Patient tolerated treatment well    Behavior During Therapy [facility] for tasks assessed/performed           Past Medical History:  Diagnosis Date  . Anemia     No past surgical history on file.  There were no vitals filed for this visit.    Subjective Assessment - 12/03/20 1405    Subjective Pt reports her legs have been hurting her so bad since she was in [place] (since she was young maybe 20 years old). Pt states legs hurt always (uses ibuprofen and feels she is taking it a log). Hurts most in the morning. Sometimes it hurts so much she can't walk. Pt states when she is standing and walking for long periods.    Limitations Standing;Walking;Lifting    How long can you stand comfortably? ~10 minutes    How long can you walk comfortably? Unable to walk around grocery store but able to walk around the house    Patient Stated Goals Improve pain    Currently in Pain? Yes    Pain Score 5     Pain Location Leg    Pain Orientation Right;Left   Feels more on right   Pain Descriptors / Indicators Tightness;Discomfort;Burning    Pain Type Chronic pain    Pain Radiating Towards reports pain moves up/down her leg, mostly in her knee and anterior shin    Pain Onset More than a month ago    Pain Frequency Constant    Pain Relieving Factors Ibuprofen, "tie my leg"    Effect of Pain on Daily  Activities Going to school              [facility] PT Assessment - 12/03/20 0001      Assessment   Medical Diagnosis M22.2X1,M22.2X2 (ICD-10-CM) - Patellofemoral pain syndrome of both knees    Referring Provider (PT) Westley Chandler, MD      Precautions   Precautions None      Restrictions   Weight Bearing Restrictions No      Balance Screen   Has the patient fallen in the past 6 months No      Home Environment   Living Environment Private residence    Living Arrangements --   Boyfriend's family   Available Help at Discharge Friend(s)    Home Access Level entry      Prior Function   Level of Independence Independent    Vocation Part time employment    Vocation Requirements Weekends at [employer]      Observation/Other Assessments   Focus on Therapeutic Outcomes (FOTO)  n/a      Sensation   Additional Comments Reports bilat numbness and tingling in bilat LEs intermittently while standing      Functional Tests   Functional tests Sit to Stand;Other  Sit to Stand   Comments 5x STS = 10 sec      Other:   Other/ Comments Plank 18 sec      Posture/Postural Control   Posture/Postural Control Postural limitations    Postural Limitations Increased lumbar lordosis;Anterior pelvic tilt      ROM / Strength   AROM / PROM / Strength Strength;AROM      AROM   Overall AROM Comments WFL    AROM Assessment Site Lumbar    Lumbar Flexion 80%   tight   Lumbar Extension Hypermobile    Lumbar - Right Side Bend WFL    Lumbar - Left Side Bend WFL    Lumbar - Right Rotation WFL    Lumbar - Left Rotation WFL      Strength   Overall Strength Comments bilat knees ~5/5    Strength Assessment Site Hip    Right/Left Hip Right;Left    Right Hip Flexion 3+/5    Right Hip Extension 5/5    Right Hip External Rotation  3/5    Right Hip Internal Rotation 3+/5    Right Hip ABduction 3+/5    Left Hip Flexion 3+/5    Left Hip Extension 5/5    Left Hip External Rotation 3+/5    Left Hip  Internal Rotation 3+/5    Left Hip ABduction 3+/5      Palpation   Spinal mobility lumbar hypermobile and flexible      Special Tests    Special Tests Lumbar    Lumbar Tests FABER test;Slump Test;Prone Knee Bend Test;Straight Leg Raise      FABER test   findings Negative    Comment Groin pull      Slump test   Findings Positive    Side Right    Comment R>L but (+) bilat      Prone Knee Bend Test   Findings Positive    Comment bilat; feels in back      Straight Leg Raise   Findings Positive    Comment bilat; R worse than L feels in front of thigh                      Objective measurements completed on examination: See above findings.               PT Education - 12/03/20 1448    Education Details Discussed exam findings, POC, HEP. Discussed sleeping prone on pillows to decrease lumbar hyperextension    Person(s) Educated Patient    Methods Explanation;Demonstration;Handout    Comprehension Tactile cues required;Need further instruction;Verbalized understanding            PT Short Term Goals - 12/03/20 1611      PT SHORT TERM GOAL #1   Title Pt will be independent with initial HEP    Time 3    Period Weeks    Status New    Target Date 12/24/20      PT SHORT TERM GOAL #2   Title Pt will be able to tolerate planking for at least 30 sec to demo improved core strength    Time 3    Period Weeks    Status New    Target Date 12/24/20      PT SHORT TERM GOAL #3   Title Pt will be able to maintain improved pelvic alignment in standing independently to reduce pain    Time 3    Period Weeks  Status New    Target Date 12/24/20      PT SHORT TERM GOAL #4   Title Pt will report at least 50% improvement in pain at night    Time 3    Period Weeks    Status New    Target Date 12/24/20             PT Long Term Goals - 12/03/20 1613      PT LONG TERM GOAL #1   Title Pt will be independent with advanced HEP    Time 6    Period  Weeks    Status New    Target Date 01/14/21      PT LONG TERM GOAL #2   Title Pt will be able to hold plank for at least 1 minute to demo improved core strength    Time 6    Period Weeks    Status New    Target Date 01/14/21      PT LONG TERM GOAL #3   Title Pt will report >75% improvement in pain for sleep and work    Time 6    Period Weeks    Status New    Target Date 01/14/21      PT LONG TERM GOAL #4   Title Pt will demo at least 4/5 hip flexion and abduction bilat    Time 6    Period Weeks    Status New    Target Date 01/14/21                  Plan - 12/03/20 1452    Clinical Impression Statement Pt is a 20 y/o F presenting to OPPT due to complaint of bilat LE myalgia (R worse than L). Pt reports pain can move from in front of her shin to her front thigh. Pain worse at night. Reports intermittent bilat LE numbness/tingling throughout the day. On assessment, pt demos hypermobile and flexible upper lumbar mobility in extension, limited in flexion with increased lumbar lordosis and anterior pelvic tilt. Pt with grossly weak hip abductors and flexors -- extensors remain fairly strong. S/S more consistent with lumbar nerve impingement as pt does not demo joint pain. Pt would benefit from PT for core and trunk stabilization and hip strengthening to optimize her level of function.    Personal Factors and Comorbidities Age;Fitness;Past/Current Experience;Time since onset of injury/illness/exacerbation    Examination-Activity Limitations Sleep;Sit;Stairs;Stand;Lift;Locomotion Level    Examination-Participation Restrictions Occupation;Community Activity;School    Stability/Clinical Decision Making Evolving/Moderate complexity    Clinical Decision Making Moderate    Rehab Potential Good    PT Frequency 1x / week    PT Duration 6 weeks    PT Treatment/Interventions ADLs/Self Care Home Management;Iontophoresis 4mg /ml Dexamethasone;Moist Heat;Ultrasound;Gait training;Functional  mobility training;Therapeutic activities;Therapeutic exercise;Balance training;Neuromuscular re-education;Patient/family education;Manual techniques;Taping;Dry needling    PT Next Visit Plan Assess response to HEP. Strengthen hip flexors & abductors. Work on core and trunk stabilization. Reduce lumbar lordosis and extension.    PT Home Exercise Plan Access Code [email or web address]    Consulted and Agree with Plan of Care Patient           Patient will benefit from skilled therapeutic intervention in order to improve the following deficits and impairments:  Increased fascial restricitons,Difficulty walking,Decreased endurance,Hypermobility,Pain,Decreased balance,Improper body mechanics,Postural dysfunction,Decreased mobility,Decreased strength,Impaired sensation  Visit Diagnosis: Pain in both lower extremities  Abnormal posture  Muscle weakness (generalized)  Other abnormalities of gait and mobility  Hypermobility syndrome  Problem List Patient Active Problem List   Diagnosis Date Noted  . Patellofemoral pain syndrome of both knees 11/11/2020  . Acne vulgaris 09/18/2020  . Anxiety state 07/09/2020  . Sports physical 05/27/2020  . Birth control counseling 03/19/2020    Check all possible CPT codes: [id number]- Therapeutic Exercise, [phone] [id number]- Neuro Re-education, [phone] - Gait Training, [phone] - Manual Therapy, [phone] - Therapeutic Activities, [phone] - Self Care and [phone] - Ultrasound        [facility] [address]April Ma L HaddamNonato PT, DPT 12/03/2020, 4:18 PM  [facility] [address] [place], [place], [address] Phone: [phone]   Fax:  [phone]  Name: Melanie Maddox MRN: [id number] Date of Birth: 2000/09/11

## 2020-12-03 NOTE — Patient Instructions (Signed)
Access Code: [email or web address] URL: [email or web address] Date: 12/03/2020 Prepared by: Vernon Prey April Kirstie Peri  Exercises Side Stepping with Resistance at [facility] - 1 x daily - 7 x weekly - 3 sets - 10 reps Standing Hip Flexion March - 1 x daily - 7 x weekly - 3 sets - 10 reps Supine 90/90 Abdominal Bracing - 1 x daily - 7 x weekly - 3 sets - 10 sec hold Supine 90/90 Alternating Heel Touches with Posterior Pelvic Tilt - 1 x daily - 7 x weekly - 1 sets - 10 reps

## 2020-12-16 ENCOUNTER — Other Ambulatory Visit: Payer: Self-pay

## 2020-12-16 ENCOUNTER — Encounter: Payer: Self-pay | Admitting: Physical Therapy

## 2020-12-16 ENCOUNTER — Ambulatory Visit: Payer: Medicaid Other | Attending: Family Medicine | Admitting: Physical Therapy

## 2020-12-16 DIAGNOSIS — M6281 Muscle weakness (generalized): Secondary | ICD-10-CM | POA: Insufficient documentation

## 2020-12-16 DIAGNOSIS — M79604 Pain in right leg: Secondary | ICD-10-CM | POA: Diagnosis not present

## 2020-12-16 DIAGNOSIS — R293 Abnormal posture: Secondary | ICD-10-CM | POA: Insufficient documentation

## 2020-12-16 DIAGNOSIS — M79605 Pain in left leg: Secondary | ICD-10-CM | POA: Diagnosis not present

## 2020-12-16 DIAGNOSIS — M357 Hypermobility syndrome: Secondary | ICD-10-CM | POA: Insufficient documentation

## 2020-12-16 DIAGNOSIS — R2689 Other abnormalities of gait and mobility: Secondary | ICD-10-CM | POA: Diagnosis not present

## 2020-12-16 NOTE — Therapy (Signed)
[facility] [address] [place], [place], [address] Phone: [phone]   Fax:  [phone]  Physical Therapy Treatment  Patient Details  Name: Melanie Maddox MRN: [id number] Date of Birth: 16-Sep-2000 Referring Provider (PT): Melanie Chandler, MD   Encounter Date: 12/16/2020   PT End of Session - 12/16/20 1623    Visit Number 2    Number of Visits 7    Date for PT Re-Evaluation 01/14/21    Authorization Type Medicaid [employer] [place]    PT Start Time 1622    PT Stop Time 1700    PT Time Calculation (min) 38 min    Activity Tolerance Patient tolerated treatment well    Behavior During Therapy [facility] for tasks assessed/performed           Past Medical History:  Diagnosis Date  . Anemia     History reviewed. No pertinent surgical history.  There were no vitals filed for this visit.   Subjective Assessment - 12/16/20 1624    Subjective Pt reports that she had her familiar pain (anterior LE pain to ankles) about 2 days ago.  This pain occurs almost every day.  She reports HEP is challenging.    Limitations Standing;Walking;Lifting    Patient Stated Goals Improve pain    Currently in Pain? No/denies                             [facility] Adult PT Treatment/Exercise - 12/16/20 0001      Exercises   Exercises Knee/Hip;Lumbar;Other Exercises      Lumbar Exercises: Stretches   Hip Flexor Stretch Limitations supine hip flexor stretch with knee flexion 3x 45''    Pelvic Tilt Limitations 5'' x10    Other Lumbar Stretch Exercise Sciatic nere glide in supine 20x ea      Lumbar Exercises: Supine   Clam Limitations alternating 2x10 ea RTB    Dead Bug Limitations 3x10      Lumbar Exercises: Sidelying   Other Sidelying Lumbar Exercises side plank from knees 5'' 'x5 ea                    PT Short Term Goals - 12/03/20 1611      PT SHORT TERM GOAL #1   Title Pt will be independent with initial  HEP    Time 3    Period Weeks    Status New    Target Date 12/24/20      PT SHORT TERM GOAL #2   Title Pt will be able to tolerate planking for at least 30 sec to demo improved core strength    Time 3    Period Weeks    Status New    Target Date 12/24/20      PT SHORT TERM GOAL #3   Title Pt will be able to maintain improved pelvic alignment in standing independently to reduce pain    Time 3    Period Weeks    Status New    Target Date 12/24/20      PT SHORT TERM GOAL #4   Title Pt will report at least 50% improvement in pain at night    Time 3    Period Weeks    Status New    Target Date 12/24/20             PT Long Term Goals - 12/03/20 1613  PT LONG TERM GOAL #1   Title Pt will be independent with advanced HEP    Time 6    Period Weeks    Status New    Target Date 01/14/21      PT LONG TERM GOAL #2   Title Pt will be able to hold plank for at least 1 minute to demo improved core strength    Time 6    Period Weeks    Status New    Target Date 01/14/21      PT LONG TERM GOAL #3   Title Pt will report >75% improvement in pain for sleep and work    Time 6    Period Weeks    Status New    Target Date 01/14/21      PT LONG TERM GOAL #4   Title Pt will demo at least 4/5 hip flexion and abduction bilat    Time 6    Period Weeks    Status New    Target Date 01/14/21                 Plan - 12/16/20 1629    Clinical Impression Statement Valgus knee collapse noted during nu-step warm up and cued for correction.  During hip flexor stretch RF and IS tightness noted bilaterally L>R.  This was added to HEP.  Therapy concentrated on anterior hip stretching combined with core stregthening with neutral pelvis.  Pt able to complete with good form and fatigue.    Personal Factors and Comorbidities Age;Fitness;Past/Current Experience;Time since onset of injury/illness/exacerbation    Examination-Activity Limitations Sleep;Sit;Stairs;Stand;Lift;Locomotion  Level    Examination-Participation Restrictions Occupation;Community Activity;School    Stability/Clinical Decision Making Evolving/Moderate complexity    Rehab Potential Good    PT Frequency 1x / week    PT Duration 6 weeks    PT Treatment/Interventions ADLs/Self Care Home Management;Iontophoresis 4mg /ml Dexamethasone;Moist Heat;Ultrasound;Gait training;Functional mobility training;Therapeutic activities;Therapeutic exercise;Balance training;Neuromuscular re-education;Patient/family education;Manual techniques;Taping;Dry needling    PT Next Visit Plan Assess response to HEP. Strengthen hip flexors & abductors. Work on core and trunk stabilization. Reduce lumbar lordosis and extension.    PT Home Exercise Plan [email or web address]    Consulted and Agree with Plan of Care Patient           Patient will benefit from skilled therapeutic intervention in order to improve the following deficits and impairments:  Increased fascial restricitons,Difficulty walking,Decreased endurance,Hypermobility,Pain,Decreased balance,Improper body mechanics,Postural dysfunction,Decreased mobility,Decreased strength,Impaired sensation  Visit Diagnosis: Pain in both lower extremities  Abnormal posture  Muscle weakness (generalized)  Other abnormalities of gait and mobility  Hypermobility syndrome     Problem List Patient Active Problem List   Diagnosis Date Noted  . Patellofemoral pain syndrome of both knees 11/11/2020  . Acne vulgaris 09/18/2020  . Anxiety state 07/09/2020  . Sports physical 05/27/2020  . Birth control counseling 03/19/2020   05/19/2020 PT, DPT 12/16/20 5:08 PM  [facility] [address] [place], [place], [place] Phone: [phone]   Fax:  [phone]  Name: Melanie Maddox MRN: [name] Date of Birth: Jul 30, 2001

## 2020-12-23 ENCOUNTER — Other Ambulatory Visit: Payer: Self-pay

## 2020-12-23 ENCOUNTER — Ambulatory Visit: Payer: Medicaid Other | Admitting: Physical Therapy

## 2020-12-23 ENCOUNTER — Encounter: Payer: Self-pay | Admitting: Physical Therapy

## 2020-12-23 DIAGNOSIS — R2689 Other abnormalities of gait and mobility: Secondary | ICD-10-CM

## 2020-12-23 DIAGNOSIS — M6281 Muscle weakness (generalized): Secondary | ICD-10-CM | POA: Diagnosis not present

## 2020-12-23 DIAGNOSIS — M79605 Pain in left leg: Secondary | ICD-10-CM

## 2020-12-23 DIAGNOSIS — M357 Hypermobility syndrome: Secondary | ICD-10-CM

## 2020-12-23 DIAGNOSIS — R293 Abnormal posture: Secondary | ICD-10-CM

## 2020-12-23 DIAGNOSIS — M79604 Pain in right leg: Secondary | ICD-10-CM

## 2020-12-23 NOTE — Therapy (Signed)
[facility] [address] [place], [place], [address] Phone: [phone]   Fax:  [phone]  Physical Therapy Treatment  Patient Details  Name: Melanie Maddox MRN: [id number] Date of Birth: Sep 03, 2000 Referring Provider (PT): Westley Chandler, MD   Encounter Date: 12/23/2020   PT End of Session - 12/23/20 1850    Visit Number 3    Number of Visits 7    Date for PT Re-Evaluation 01/14/21    Authorization Type Medicaid [employer] [place]    PT Start Time [place]   pt arrived late   PT Stop Time 1911    PT Time Calculation (min) 23 min    Activity Tolerance Patient tolerated treatment well    Behavior During Therapy [facility] for tasks assessed/performed           Past Medical History:  Diagnosis Date  . Anemia     History reviewed. No pertinent surgical history.  There were no vitals filed for this visit.   Subjective Assessment - 12/23/20 1851    Subjective Pt reports that her pain is significantly improved.  "the pain is no longer constant"    Limitations Standing;Walking;Lifting    Patient Stated Goals Improve pain    Pain Score 0-No pain                             [facility] Adult PT Treatment/Exercise - 12/23/20 0001      Lumbar Exercises: Stretches   Hip Flexor Stretch Limitations supine hip flexor stretch with knee flexion 3x 45''    Pelvic Tilt Limitations --    Other Lumbar Stretch Exercise Sciatic nere glide in supine 20x ea      Lumbar Exercises: Supine   Clam Limitations --    Dead Bug Limitations 3x10    Bridge Limitations 2x10 with alternating kick out                    PT Short Term Goals - 12/03/20 1611      PT SHORT TERM GOAL #1   Title Pt will be independent with initial HEP    Time 3    Period Weeks    Status New    Target Date 12/24/20      PT SHORT TERM GOAL #2   Title Pt will be able to tolerate planking for at least 30 sec to demo improved core strength     Time 3    Period Weeks    Status New    Target Date 12/24/20      PT SHORT TERM GOAL #3   Title Pt will be able to maintain improved pelvic alignment in standing independently to reduce pain    Time 3    Period Weeks    Status New    Target Date 12/24/20      PT SHORT TERM GOAL #4   Title Pt will report at least 50% improvement in pain at night    Time 3    Period Weeks    Status New    Target Date 12/24/20             PT Long Term Goals - 12/03/20 1613      PT LONG TERM GOAL #1   Title Pt will be independent with advanced HEP    Time 6    Period Weeks    Status New    Target Date  01/14/21      PT LONG TERM GOAL #2   Title Pt will be able to hold plank for at least 1 minute to demo improved core strength    Time 6    Period Weeks    Status New    Target Date 01/14/21      PT LONG TERM GOAL #3   Title Pt will report >75% improvement in pain for sleep and work    Time 6    Period Weeks    Status New    Target Date 01/14/21      PT LONG TERM GOAL #4   Title Pt will demo at least 4/5 hip flexion and abduction bilat    Time 6    Period Weeks    Status New    Target Date 01/14/21                 Plan - 12/23/20 1907    Clinical Impression Statement Pt is progressing well with therapy.  We continued to work on anterior hip flexibility combined with hip extensor and trunk flexor strength.  Pt responds with fatigue but no increase in pain.  updated HEP to include dead bug and side plank from knees.    Personal Factors and Comorbidities Age;Fitness;Past/Current Experience;Time since onset of injury/illness/exacerbation    Examination-Activity Limitations Sleep;Sit;Stairs;Stand;Lift;Locomotion Level    Examination-Participation Restrictions Occupation;Community Activity;School    Stability/Clinical Decision Making Evolving/Moderate complexity    Rehab Potential Good    PT Frequency 1x / week    PT Duration 6 weeks    PT Treatment/Interventions ADLs/Self  Care Home Management;Iontophoresis 4mg /ml Dexamethasone;Moist Heat;Ultrasound;Gait training;Functional mobility training;Therapeutic activities;Therapeutic exercise;Balance training;Neuromuscular re-education;Patient/family education;Manual techniques;Taping;Dry needling    PT Next Visit Plan Assess response to HEP. Strengthen hip flexors & abductors. Work on core and trunk stabilization. Reduce lumbar lordosis and extension.    PT Home Exercise Plan [email or web address]    Consulted and Agree with Plan of Care Patient           Patient will benefit from skilled therapeutic intervention in order to improve the following deficits and impairments:  Increased fascial restricitons,Difficulty walking,Decreased endurance,Hypermobility,Pain,Decreased balance,Improper body mechanics,Postural dysfunction,Decreased mobility,Decreased strength,Impaired sensation  Visit Diagnosis: Pain in both lower extremities  Abnormal posture  Muscle weakness (generalized)  Other abnormalities of gait and mobility  Hypermobility syndrome     Problem List Patient Active Problem List   Diagnosis Date Noted  . Patellofemoral pain syndrome of both knees 11/11/2020  . Acne vulgaris 09/18/2020  . Anxiety state 07/09/2020  . Sports physical 05/27/2020  . Birth control counseling 03/19/2020    05/19/2020 12/23/2020, 7:14 PM  [facility] [address] [place], [place], [place] Phone: [phone]   Fax:  [phone]  Name: Melanie Maddox MRN: [name] Date of Birth: 12/08/2000

## 2020-12-30 ENCOUNTER — Other Ambulatory Visit: Payer: Self-pay

## 2020-12-30 ENCOUNTER — Ambulatory Visit: Payer: Medicaid Other | Admitting: Physical Therapy

## 2020-12-30 DIAGNOSIS — R293 Abnormal posture: Secondary | ICD-10-CM | POA: Diagnosis not present

## 2020-12-30 DIAGNOSIS — M79605 Pain in left leg: Secondary | ICD-10-CM

## 2020-12-30 DIAGNOSIS — M6281 Muscle weakness (generalized): Secondary | ICD-10-CM | POA: Diagnosis not present

## 2020-12-30 DIAGNOSIS — M357 Hypermobility syndrome: Secondary | ICD-10-CM | POA: Diagnosis not present

## 2020-12-30 DIAGNOSIS — M79604 Pain in right leg: Secondary | ICD-10-CM

## 2020-12-30 DIAGNOSIS — R2689 Other abnormalities of gait and mobility: Secondary | ICD-10-CM

## 2020-12-30 NOTE — Therapy (Signed)
[place], [place], [address] Phone: [phone]   Fax:  [phone]  Physical Therapy Treatment  Patient Details  Name: Melanie Maddox MRN: [id number] Date of Birth: 2001/01/30 Referring Provider (PT): Martyn Malay, MD   Encounter Date: 12/30/2020   PT End of Session - 12/30/20 1150    Visit Number 4    Number of Visits 7    Date for PT Re-Evaluation 01/14/21    Authorization Type Medicaid [employer]    PT Start Time 1150    PT Stop Time 1230    PT Time Calculation (min) 40 min    Activity Tolerance Patient tolerated treatment well    Behavior During Therapy [facility] for tasks assessed/performed           Past Medical History:  Diagnosis Date  . Anemia     No past surgical history on file.  There were no vitals filed for this visit.   Subjective Assessment - 12/30/20 1152    Subjective Pt states she was hurting last night but "not that much." Overall she reports she is getting stronger and feeling better. Pt reports compliance to HEP.    Limitations Standing;Walking;Lifting    How long can you stand comfortably? ~10 minutes    How long can you walk comfortably? Unable to walk around grocery store but able to walk around the house    Patient Stated Goals Improve pain    Currently in Pain? No/denies              [facility] PT Assessment - 12/30/20 0001      Other:   Other/ Comments Plank 23 sec forearm and toes                         [facility] Adult PT Treatment/Exercise - 12/30/20 0001      Lumbar Exercises: Stretches   Other Lumbar Stretch Exercise Sciatic nere glide in supine 20x ea    Other Lumbar Stretch Exercise Femoral nerve glide x20 each      Lumbar Exercises: Aerobic   Elliptical L1 x 5 min      Lumbar Exercises: Supine   Dead Bug Limitations 2x20   with leg extension   Bridge Limitations 2x10 with alternating kick out    Other Supine Lumbar  Exercises 90/90 double heel tap 2x5      Lumbar Exercises: Sidelying   Hip Abduction Right;Left;20 reps    Other Sidelying Lumbar Exercises side plank from knees 2x30" each side      Lumbar Exercises: Prone   Other Prone Lumbar Exercises Front plank on toes x23 sec; on knees x20 sec      Lumbar Exercises: Quadruped   Opposite Arm/Leg Raise Right arm/Left leg;Left arm/Right leg;20 reps                    PT Short Term Goals - 12/30/20 1155      PT SHORT TERM GOAL #1   Title Pt will be independent with initial HEP    Time 3    Period Weeks    Status Achieved    Target Date 12/24/20      PT SHORT TERM GOAL #2   Title Pt will be able to tolerate planking for at least 30 sec to demo improved core strength    Baseline 23 sec    Time 3    Period Weeks  Status Partially Met    Target Date 12/24/20      PT SHORT TERM GOAL #3   Title Pt will be able to maintain improved pelvic alignment in standing independently to reduce pain    Time 3    Period Weeks    Status Achieved    Target Date 12/24/20      PT SHORT TERM GOAL #4   Title Pt will report at least 50% improvement in pain at night    Baseline Reports 25% improvement (now just to hips and not all the way down her leg)    Time 3    Period Weeks    Status Partially Met    Target Date 12/24/20             PT Long Term Goals - 12/03/20 1613      PT LONG TERM GOAL #1   Title Pt will be independent with advanced HEP    Time 6    Period Weeks    Status New    Target Date 01/14/21      PT LONG TERM GOAL #2   Title Pt will be able to hold plank for at least 1 minute to demo improved core strength    Time 6    Period Weeks    Status New    Target Date 01/14/21      PT LONG TERM GOAL #3   Title Pt will report >75% improvement in pain for sleep and work    Time 6    Period Weeks    Status New    Target Date 01/14/21      PT LONG TERM GOAL #4   Title Pt will demo at least 4/5 hip flexion and  abduction bilat    Time 6    Period Weeks    Status New    Target Date 01/14/21                 Plan - 12/30/20 1208    Clinical Impression Statement Pt is meeting STGs. Continued pelvic and core strengthening. Pt fatigues easily but no pain. Pt reports continued improvement with pain    Personal Factors and Comorbidities Age;Fitness;Past/Current Experience;Time since onset of injury/illness/exacerbation    Examination-Activity Limitations Sleep;Sit;Stairs;Stand;Lift;Locomotion Level    Examination-Participation Restrictions Occupation;Community Activity;School    Stability/Clinical Decision Making Evolving/Moderate complexity    Rehab Potential Good    PT Frequency 1x / week    PT Duration 6 weeks    PT Treatment/Interventions ADLs/Self Care Home Management;Iontophoresis 53m/ml Dexamethasone;Moist Heat;Ultrasound;Gait training;Functional mobility training;Therapeutic activities;Therapeutic exercise;Balance training;Neuromuscular re-education;Patient/family education;Manual techniques;Taping;Dry needling    PT Next Visit Plan Assess response to HEP. Strengthen hip flexors & abductors. Work on core and trunk stabilization. Reduce lumbar lordosis and extension.    PT Home Exercise Plan [email or web address]    Consulted and Agree with Plan of Care Patient           Patient will benefit from skilled therapeutic intervention in order to improve the following deficits and impairments:  Increased fascial restricitons,Difficulty walking,Decreased endurance,Hypermobility,Pain,Decreased balance,Improper body mechanics,Postural dysfunction,Decreased mobility,Decreased strength,Impaired sensation  Visit Diagnosis: Pain in both lower extremities  Abnormal posture  Muscle weakness (generalized)  Other abnormalities of gait and mobility     Problem List Patient Active Problem List   Diagnosis Date Noted  . Patellofemoral pain syndrome of both knees 11/11/2020  . Acne vulgaris 09/18/2020  .  Anxiety state 07/09/2020  . Sports physical 05/27/2020  . Birth  control counseling 03/19/2020    [facility] [address] PT, DPT 12/30/2020, 12:31 PM  [facility] [address] [place], [place], [address] Phone: [phone]   Fax:  [phone]  Name: Melanie Maddox MRN: [id number] Date of Birth: [birth year]

## 2021-01-06 ENCOUNTER — Ambulatory Visit: Payer: Medicaid Other | Attending: Family Medicine | Admitting: Physical Therapy

## 2021-01-06 ENCOUNTER — Other Ambulatory Visit: Payer: Self-pay

## 2021-01-06 DIAGNOSIS — M79605 Pain in left leg: Secondary | ICD-10-CM | POA: Insufficient documentation

## 2021-01-06 DIAGNOSIS — M357 Hypermobility syndrome: Secondary | ICD-10-CM | POA: Diagnosis not present

## 2021-01-06 DIAGNOSIS — R293 Abnormal posture: Secondary | ICD-10-CM | POA: Insufficient documentation

## 2021-01-06 DIAGNOSIS — R2689 Other abnormalities of gait and mobility: Secondary | ICD-10-CM

## 2021-01-06 DIAGNOSIS — M79604 Pain in right leg: Secondary | ICD-10-CM | POA: Diagnosis not present

## 2021-01-06 DIAGNOSIS — M6281 Muscle weakness (generalized): Secondary | ICD-10-CM | POA: Diagnosis not present

## 2021-01-06 NOTE — Therapy (Signed)
[place], [place], [address] Phone: [phone]   Fax:  [phone]  Physical Therapy Treatment  Patient Details  Name: Melanie Maddox MRN: [id number] Date of Birth: March 18, 2001 Referring Provider (PT): Martyn Malay, MD   Encounter Date: 01/06/2021   PT End of Session - 01/06/21 1159    Visit Number 5    Number of Visits 7    Date for PT Re-Evaluation 01/14/21    Authorization Type Medicaid [employer]    PT Start Time 9021   late arrival   PT Stop Time 1230    PT Time Calculation (min) 31 min    Activity Tolerance Patient tolerated treatment well    Behavior During Therapy [facility] for tasks assessed/performed           Past Medical History:  Diagnosis Date  . Anemia     No past surgical history on file.  There were no vitals filed for this visit.   Subjective Assessment - 01/06/21 1202    Subjective Pt states "yesterday night it was really horrible; I couldn't sleep." No other issues besides that night. Pt states she did not know what she did different.    Limitations Standing;Walking;Lifting    How long can you stand comfortably? ~10 minutes    How long can you walk comfortably? Unable to walk around grocery store but able to walk around the house    Patient Stated Goals Improve pain    Currently in Pain? No/denies                             [facility] Adult PT Treatment/Exercise - 01/06/21 0001      Lumbar Exercises: Stretches   Double Knee to Chest Stretch 30 seconds    Piriformis Stretch Right;30 seconds      Lumbar Exercises: Aerobic   Elliptical L1 x 5 min      Lumbar Exercises: Supine   Dead Bug Limitations 2x20    Bridge Compliant;20 reps    Bridge Limitations with feet on 4" step    Straight Leg Raise 20 reps    Other Supine Lumbar Exercises 90/90 double heel tap on to 4" step 2x6      Lumbar Exercises: Sidelying   Hip Abduction Right;Left;20 reps     Hip Abduction Limitations attempted with red tband but pt cramping    Other Sidelying Lumbar Exercises side plank from knees 2x30" each side      Lumbar Exercises: Prone   Other Prone Lumbar Exercises Front plank on toes x23 sec; on knees x20 sec                  PT Education - 01/06/21 1245    Education Details Discussed DKC and sleeping on pillows to reduce lumbar hyperextension that could be causing her symptoms at night    Person(s) Educated Patient    Methods Explanation;Demonstration;Handout;Verbal cues;Tactile cues    Comprehension Verbalized understanding;Returned demonstration;Tactile cues required            PT Short Term Goals - 12/30/20 1155      PT SHORT TERM GOAL #1   Title Pt will be independent with initial HEP    Time 3    Period Weeks    Status Achieved    Target Date 12/24/20      PT SHORT TERM GOAL #2   Title Pt will be able  to tolerate planking for at least 30 sec to demo improved core strength    Baseline 23 sec    Time 3    Period Weeks    Status Partially Met    Target Date 12/24/20      PT SHORT TERM GOAL #3   Title Pt will be able to maintain improved pelvic alignment in standing independently to reduce pain    Time 3    Period Weeks    Status Achieved    Target Date 12/24/20      PT SHORT TERM GOAL #4   Title Pt will report at least 50% improvement in pain at night    Baseline Reports 25% improvement (now just to hips and not all the way down her leg)    Time 3    Period Weeks    Status Partially Met    Target Date 12/24/20             PT Long Term Goals - 12/03/20 1613      PT LONG TERM GOAL #1   Title Pt will be independent with advanced HEP    Time 6    Period Weeks    Status New    Target Date 01/14/21      PT LONG TERM GOAL #2   Title Pt will be able to hold plank for at least 1 minute to demo improved core strength    Time 6    Period Weeks    Status New    Target Date 01/14/21      PT LONG TERM GOAL  #3   Title Pt will report >75% improvement in pain for sleep and work    Time 6    Period Weeks    Status New    Target Date 01/14/21      PT LONG TERM GOAL #4   Title Pt will demo at least 4/5 hip flexion and abduction bilat    Time 6    Period Weeks    Status New    Target Date 01/14/21                 Plan - 01/06/21 1224    Clinical Impression Statement Continued to work on hip/pelvic/core strengthening. R side remains weaker than L with sporadic increased symptoms for pt at night. Pt with no other instances of pain in the past week except for 1. Discussed methods to manage symptoms at home when she's in bed    Personal Factors and Comorbidities Age;Fitness;Past/Current Experience;Time since onset of injury/illness/exacerbation    Examination-Activity Limitations Sleep;Sit;Stairs;Stand;Lift;Locomotion Level    Examination-Participation Restrictions Occupation;Community Activity;School    Stability/Clinical Decision Making Evolving/Moderate complexity    Rehab Potential Good    PT Frequency 1x / week    PT Duration 6 weeks    PT Treatment/Interventions ADLs/Self Care Home Management;Iontophoresis 4mg /ml Dexamethasone;Moist Heat;Ultrasound;Gait training;Functional mobility training;Therapeutic activities;Therapeutic exercise;Balance training;Neuromuscular re-education;Patient/family education;Manual techniques;Taping;Dry needling    PT Next Visit Plan Assess response to HEP. Strengthen hip flexors & abductors. Work on core and trunk stabilization. Reduce lumbar lordosis and extension.    PT Home Exercise Plan [email or web address]    Consulted and Agree with Plan of Care Patient           Patient will benefit from skilled therapeutic intervention in order to improve the following deficits and impairments:  Increased fascial restricitons,Difficulty walking,Decreased endurance,Hypermobility,Pain,Decreased balance,Improper body mechanics,Postural dysfunction,Decreased mobility,Decreased  strength,Impaired sensation  Visit Diagnosis: Pain in both lower extremities  Abnormal posture  Muscle weakness (generalized)  Other abnormalities of gait and mobility  Hypermobility syndrome     Problem List Patient Active Problem List   Diagnosis Date Noted  . Patellofemoral pain syndrome of both knees 11/11/2020  . Acne vulgaris 09/18/2020  . Anxiety state 07/09/2020  . Sports physical 05/27/2020  . Birth control counseling 03/19/2020    [facility] April Gordy Levan  PT, DPT 01/06/2021, 12:46 PM  [facility] [address] [place], [place], [address] Phone: [phone]   Fax:  [phone]  Name: Melanie Maddox MRN: [id number] Date of Birth: February 03, 2001

## 2021-01-12 ENCOUNTER — Ambulatory Visit (INDEPENDENT_AMBULATORY_CARE_PROVIDER_SITE_OTHER): Payer: Medicaid Other | Admitting: Family Medicine

## 2021-01-12 ENCOUNTER — Other Ambulatory Visit: Payer: Self-pay

## 2021-01-12 VITALS — BP 98/60 | Ht 68.0 in | Wt 109.1 lb

## 2021-01-12 DIAGNOSIS — M222X1 Patellofemoral disorders, right knee: Secondary | ICD-10-CM

## 2021-01-12 DIAGNOSIS — F411 Generalized anxiety disorder: Secondary | ICD-10-CM | POA: Diagnosis not present

## 2021-01-12 DIAGNOSIS — M222X2 Patellofemoral disorders, left knee: Secondary | ICD-10-CM | POA: Diagnosis not present

## 2021-01-12 NOTE — Assessment & Plan Note (Signed)
She is having some improvement with physical therapy, discussed continuing physical therapy and encouraged daily exercises whether she is of physical therapy or not.  Given that we have not had imaging and she is not progressing quickly, we will go ahead and obtain x-rays of both knees.  Advised that she can use ibuprofen as needed in addition to Tylenol.

## 2021-01-12 NOTE — Patient Instructions (Signed)
Thank you for coming to see me today. It was a pleasure. Today we talked about:   You can take 2 tablets of ibuprofen every 8 hours as needed for your knee pain.  You can take that with tylenol.  I have placed an order for x-rays for your knees.  Please go to [facility] to have this completed.  You do not need an appointment.  We will contact you with your results afterwards.  Please follow-up with new PCP in 1 month.  If you have any questions or concerns, please do not hesitate to call the office at [phone].  Best,   Luis Abed, DO

## 2021-01-12 NOTE — Progress Notes (Signed)
    SUBJECTIVE:   CHIEF COMPLAINT / HPI:   Follow-up anxiety state Last seen on 11/11/2020 At that time had been doing better and taking hydroxyzine very sparingly Still living with boyfriend and family She hadn't had an appetite for the last two weeks No nausea, able to eat, just hasn't wanted to She graduated and was stressed after that She is planning to start working for [employer] next week Hopefully will start school later Hasn't been taking hydroxyzine Family came to graduation, getting a little bit better She does not want a therapist   Flowsheet Row Office Visit from 01/12/2021 in [facility]  PHQ-9 Total Score 10     GAD 7 : Generalized Anxiety Score 01/12/2021 09/30/2020 09/18/2020 09/02/2020  Nervous, Anxious, on Edge 2 1 2 2   Control/stop worrying 1 1 1 2   Worry too much - different things 2 2 3 2   Trouble relaxing 1 0 2 3  Restless 1 0 1 2  Easily annoyed or irritable 1 1 2 1   Afraid - awful might happen 2 2 3 2   Total GAD 7 Score 10 7 14 14   Anxiety Difficulty - - - Somewhat difficult      Follow-up patellofemoral pain syndrome Has been going to physical therapy Has helped some but still has a lot of pain in her knees  Would like x-rays Has been taking tylenol for pain once a day that helps a little  PERTINENT  PMH / PSH: Anxiety state  OBJECTIVE:   BP 98/60   Ht 5\' 8"  (1.727 m)   Wt 109 lb 2 oz (49.5 kg)   LMP 01/07/2021   BMI 16.59 kg/m    Physical Exam:  General: 20 y.o. female in NAD Lungs: Breathing comfortably on room air Skin: warm and dry Extremities: No edema, ambulating without difficulty Psych: Mood and affect appropriate for circumstance, appropriate dress, thought process linear and logical, good insight, no SI   ASSESSMENT/PLAN:   Anxiety state She has had some worsening in her anxiety since last visit.  She has had a significant amount of stress recently and has graduated from high school.  Her family was able to  attend her graduation, which made her happy, but was somewhat stressful.  She is trying to get a job now before she starts college.  Overall, I suspect that her stress is contributing to her decrease in appetite over the last few weeks.  We will have her follow-up in 1 month, advised her that if she starts having nausea or pain when she is trying to eat, that she should follow-up sooner.  She is no longer on medication to use as needed.  She declines therapy today.  No SI.  Follow-up in 1 month.  Patellofemoral pain syndrome of both knees She is having some improvement with physical therapy, discussed continuing physical therapy and encouraged daily exercises whether she is of physical therapy or not.  Given that we have not had imaging and she is not progressing quickly, we will go ahead and obtain x-rays of both knees.  Advised that she can use ibuprofen as needed in addition to Tylenol.     , DO [facility]

## 2021-01-12 NOTE — Assessment & Plan Note (Signed)
She has had some worsening in her anxiety since last visit.  She has had a significant amount of stress recently and has graduated from high school.  Her family was able to attend her graduation, which made her happy, but was somewhat stressful.  She is trying to get a job now before she starts college.  Overall, I suspect that her stress is contributing to her decrease in appetite over the last few weeks.  We will have her follow-up in 1 month, advised her that if she starts having nausea or pain when she is trying to eat, that she should follow-up sooner.  She is no longer on medication to use as needed.  She declines therapy today.  No SI.  Follow-up in 1 month.

## 2021-01-13 ENCOUNTER — Ambulatory Visit: Payer: Medicaid Other | Admitting: Physical Therapy

## 2021-01-13 DIAGNOSIS — M6281 Muscle weakness (generalized): Secondary | ICD-10-CM

## 2021-01-13 DIAGNOSIS — R293 Abnormal posture: Secondary | ICD-10-CM | POA: Diagnosis not present

## 2021-01-13 DIAGNOSIS — M79604 Pain in right leg: Secondary | ICD-10-CM | POA: Diagnosis not present

## 2021-01-13 DIAGNOSIS — R2689 Other abnormalities of gait and mobility: Secondary | ICD-10-CM | POA: Diagnosis not present

## 2021-01-13 DIAGNOSIS — M79605 Pain in left leg: Secondary | ICD-10-CM | POA: Diagnosis not present

## 2021-01-13 DIAGNOSIS — M357 Hypermobility syndrome: Secondary | ICD-10-CM | POA: Diagnosis not present

## 2021-01-13 NOTE — Therapy (Addendum)
[place], [place], [address] Phone: [phone]   Fax:  [phone]  PHYSICAL THERAPY UNPLANNED DISCHARGE SUMMARY   Visits from Start of Care: 6  Current functional level related to goals / functional outcomes: Current status unknown   Remaining deficits: Current status unknown   Education / Equipment: Pt has not returned since visit listed below.  Patient goals were not assessed. Patient is being discharged due to not returning since the last visit.  (below note addended to include the above D/C summary on 03/13/21)   Physical Therapy Treatment and Re-Cert  Patient Details  Name: Vanecia Limpert MRN: [id number] Date of Birth: [birth year] Referring Provider (PT): Martyn Malay, MD   Encounter Date: 01/13/2021   PT End of Session - 01/13/21 1227     Visit Number 6    Number of Visits 7    Date for PT Re-Evaluation 01/14/21    Authorization Type Medicaid [employer]; approved 6 visits until 01/20/21    Authorization - Visit Number 5    Authorization - Number of Visits 6    PT Start Time 1150    PT Stop Time 1230    PT Time Calculation (min) 40 min    Activity Tolerance Patient tolerated treatment well    Behavior During Therapy [facility] for tasks assessed/performed             Past Medical History:  Diagnosis Date   Anemia     No past surgical history on file.  There were no vitals filed for this visit.   Subjective Assessment - 01/13/21 1158     Subjective Pt states on Saturday the pain happened for a little bit but on Sunday night she was hurting the same (points to L lateral calf). It took 6 hours to go away. Besides feeling it at night she has not been feeling the pain otherwise. Pt states she talked to her doctor who is ordering an x-ray for her.    Limitations Standing;Walking;Lifting    How long can you stand comfortably? ~10 minutes    How long can you walk  comfortably? Unable to walk around grocery store but able to walk around the house    Patient Stated Goals Improve pain    Currently in Pain? No/denies                [facility] PT Assessment - 01/13/21 0001       Other:   Other/ Comments Plank 42 sec forearm & knees; 23 sec on toes      AROM   Lumbar Flexion WFL    Lumbar Extension WFL    Lumbar - Right Side Bend WFL    Lumbar - Left Side Bend WFL    Lumbar - Right Rotation [facility]      Strength   Right Hip Flexion 4-/5    Right Hip Extension 4+/5    Right Hip External Rotation  3+/5    Right Hip Internal Rotation 3+/5    Right Hip ABduction 4/5    Left Hip Flexion 4+/5    Left Hip Extension 5/5    Left Hip External Rotation 4+/5    Left Hip Internal Rotation 4+/5    Left Hip ABduction 4/5      FABER test   findings Negative      Slump test   Findings Positive    Side Left    Comment L>R but (+) bilat  Prone Knee Bend Test   Findings Positive    Side Right    Comment Only feels in her back on the R; L side she can now feel in her thigh                           [facility] Adult PT Treatment/Exercise - 01/13/21 0001       Lumbar Exercises: Aerobic   Elliptical L1 x 5 min      Lumbar Exercises: Seated   Other Seated Lumbar Exercises Sciatic nerve glides x20 bilat      Lumbar Exercises: Supine   Other Supine Lumbar Exercises Supine hip flexion green tband 2x10      Lumbar Exercises: Sidelying   Other Sidelying Lumbar Exercises side plank from knees 2x30" each side      Lumbar Exercises: Prone   Single Arm Raise 10 reps    Straight Leg Raise 10 reps    Opposite Arm/Leg Raise Right arm/Left leg;Left arm/Right leg;10 reps    Other Prone Lumbar Exercises Femoral nerve glide x20 bilat                      PT Short Term Goals - 01/13/21 1232       PT SHORT TERM GOAL #1   Title Pt will be independent with initial HEP    Time 3    Period Weeks    Status Achieved    Target Date  12/24/20      PT SHORT TERM GOAL #2   Title Pt will be able to tolerate planking for at least 30 sec to demo improved core strength    Baseline 23 sec    Time 3    Period Weeks    Status Achieved    Target Date 12/24/20      PT SHORT TERM GOAL #3   Title Pt will be able to maintain improved pelvic alignment in standing independently to reduce pain    Time 3    Period Weeks    Status Achieved    Target Date 12/24/20      PT SHORT TERM GOAL #4   Title Pt will report at least 50% improvement in pain at night    Baseline Reports 25% improvement (now just to hips and not all the way down her leg)    Time 3    Period Weeks    Status Partially Met    Target Date 12/24/20               PT Long Term Goals - 01/13/21 1233       PT LONG TERM GOAL #1   Title Pt will be independent with advanced HEP    Time 6    Period Weeks    Status Achieved      PT LONG TERM GOAL #2   Title Pt will be able to hold plank for at least 1 minute to demo improved core strength    Baseline 49 sec on forearm and knees 01/13/21    Time --    Period Weeks    Status On-going    Target Date 02/03/21      PT LONG TERM GOAL #3   Title Pt will report >75% improvement in pain for sleep and work    Baseline "No change"; pain only when sleeping 01/13/21    Time --    Period Weeks    Status  On-going    Target Date 02/03/21      PT LONG TERM GOAL #4   Title Pt will demo at least 4/5 hip flexion and abduction bilat    Baseline Hip abduction grossly 4/5; R hip flexor 3+/5    Time --    Period Weeks    Status On-going    Target Date 02/03/21                   Plan - 01/13/21 1228     Clinical Impression Statement Re-checked pt's goals. She has met all of her STGs and only partially met some of her LTGs. Pt with overall improvement in strength; however, R hip flexion remains the weakest. Addressed accordingly this session. Added paraspinal stabilization exercises in prone this session as well  to continue to progress pt's stability. Pt's core is slowly improving -- pt able to increase her hold in plank position but not to 1 minute. Pt continues to have sporadic night time pain radiating down her thigh and at times her calf which can last hours at a time. No longer any pain with mobility at work. Pt to get imaging for her knee -- depending on results pt could benefit from 3 more additional weeks of therapy to continue her progression.    Personal Factors and Comorbidities Age;Fitness;Past/Current Experience;Time since onset of injury/illness/exacerbation    Examination-Activity Limitations Sleep;Sit;Stairs;Stand;Lift;Locomotion Level    Examination-Participation Restrictions Occupation;Community Activity;School    Stability/Clinical Decision Making Evolving/Moderate complexity    Rehab Potential Good    PT Frequency 1x / week    PT Duration 3 weeks    PT Treatment/Interventions ADLs/Self Care Home Management;Iontophoresis 5m/ml Dexamethasone;Moist Heat;Ultrasound;Gait training;Functional mobility training;Therapeutic activities;Therapeutic exercise;Balance training;Neuromuscular re-education;Patient/family education;Manual techniques;Taping;Dry needling    PT Next Visit Plan Assess response to HEP. Strengthen hip flexors & abductors. Work on core and spine stabilization. Consider exercises on physioball. Reduce lumbar lordosis and extension.    PT Home Exercise Plan [id number]   Consulted and Agree with Plan of Care Patient             Patient will benefit from skilled therapeutic intervention in order to improve the following deficits and impairments:  Increased fascial restricitons,Difficulty walking,Decreased endurance,Hypermobility,Pain,Decreased balance,Improper body mechanics,Postural dysfunction,Decreased mobility,Decreased strength,Impaired sensation  Visit Diagnosis: Pain in both lower extremities  Abnormal posture  Muscle weakness (generalized)  Other abnormalities of  gait and mobility     Problem List Patient Active Problem List   Diagnosis Date Noted   Patellofemoral pain syndrome of both knees 11/11/2020   Acne vulgaris 09/18/2020   Anxiety state 07/09/2020   Sports physical 05/27/2020   Birth control counseling 03/19/2020   Check all possible CPT codes: 956213 Therapeutic Exercise, 97112- Neuro Re-education, [phone]- Gait Training, 97140 - Manual Therapy, 97530 - Therapeutic Activities, [phone]- Self Care and [phone]- Aquatic therapy           Eileen Croswell April MGordy LevanPT, DPT 01/13/2021, 12:40 PM  [facility]GB[address] [place] [address]Phone: [phone]  Fax:  [phone] Name: SAllexa AcoffMRN: [id number] of Birth: 106/12/2000

## 2021-01-20 ENCOUNTER — Ambulatory Visit: Payer: Medicaid Other | Admitting: Physical Therapy

## 2021-01-27 ENCOUNTER — Ambulatory Visit: Payer: Medicaid Other | Admitting: Physical Therapy

## 2021-03-22 ENCOUNTER — Ambulatory Visit: Payer: Medicaid Other | Admitting: Student

## 2021-05-03 NOTE — Progress Notes (Signed)
    SUBJECTIVE:   CHIEF COMPLAINT / HPI:   "Having periods twice a month": 20 year old female presenting out of concern of having about 2 periods per month over the past 3 months.  She states that before July she was having one period a month but since July she was having 2 per month. The periods are more heavy with about 5 days of bleeding twice per month. She was taking a birth control pill but stopped it earlier this month. She states that even on the birth control pill she was having two periods per month.  She stopped the birth control pills after they do not seem to be helping.  PERTINENT  PMH / PSH: None relevant  OBJECTIVE:   BP 117/72   Pulse 81   Ht 5\' 7"  (1.702 m)   Wt 105 lb 2 oz (47.7 kg)   LMP 04/21/2021   SpO2 100%   BMI 16.46 kg/m    General: NAD, pleasant, able to participate in exam Respiratory: No respiratory distress Skin: warm and dry, no rashes noted Psych: Normal affect and mood   ASSESSMENT/PLAN:   Menorrhagia with irregular cycle Patient with heavy uterine bleeding which is happening about twice per month since July.  She has been on oral contraception over that time but continued to have bleeding twice per month.  Prior to July she was having about 1 period per month.  She recently stopped her OCPs about a month ago and continues to have these heavy periods.  It is a bit strange that she was having 2 periods a month while on OCPs, she does endorse taking these correctly and not missing doses.  We will check TSH and CBC today.  Pregnancy test is negative.  Discussed using alternative OCPs versus progesterone challenge with the patient.  We will order progesterone challenge after discussing further with the patient to see if we can reset her uterine lining with Provera 10 mg daily for 14 days.  She will follow back up with me in 1 month to see how she is doing.   August, DO [facility]

## 2021-05-04 ENCOUNTER — Ambulatory Visit (INDEPENDENT_AMBULATORY_CARE_PROVIDER_SITE_OTHER): Payer: Medicaid Other | Admitting: Family Medicine

## 2021-05-04 ENCOUNTER — Other Ambulatory Visit: Payer: Self-pay

## 2021-05-04 ENCOUNTER — Ambulatory Visit
Admission: RE | Admit: 2021-05-04 | Discharge: 2021-05-04 | Disposition: A | Discharge status: Home / Self Care | Payer: Medicaid Other | Source: Ambulatory Visit | Attending: Family Medicine | Admitting: Family Medicine

## 2021-05-04 ENCOUNTER — Other Ambulatory Visit: Payer: Self-pay | Admitting: Family Medicine

## 2021-05-04 VITALS — BP 117/72 | HR 81 | Ht 67.0 in | Wt 105.1 lb

## 2021-05-04 DIAGNOSIS — N921 Excessive and frequent menstruation with irregular cycle: Secondary | ICD-10-CM | POA: Insufficient documentation

## 2021-05-04 DIAGNOSIS — M25561 Pain in right knee: Secondary | ICD-10-CM | POA: Diagnosis not present

## 2021-05-04 DIAGNOSIS — M222X2 Patellofemoral disorders, left knee: Secondary | ICD-10-CM

## 2021-05-04 DIAGNOSIS — M25562 Pain in left knee: Secondary | ICD-10-CM | POA: Diagnosis not present

## 2021-05-04 LAB — POCT URINE PREGNANCY: Preg Test, Ur: NEGATIVE

## 2021-05-04 MED ORDER — MEDROXYPROGESTERONE ACETATE 10 MG PO TABS: 10.0000 mg | ORAL_TABLET | Freq: Every day | ORAL | 0 refills | Status: DC

## 2021-05-04 NOTE — Patient Instructions (Signed)
We checked a pregnancy test today that was negative.  We will check some blood work including your thyroid labs and your blood counts.  I am going to prescribe a medication for you to take to try to reset your uterine lining.  You will take this daily for 14 days and then stop.  When you stop you should have a normal period and then hopefully your cycle will become back to normal.  I would like to see you in 1 month to see how you are doing.

## 2021-05-04 NOTE — Assessment & Plan Note (Signed)
Patient with heavy uterine bleeding which is happening about twice per month since July.  She has been on oral contraception over that time but continued to have bleeding twice per month.  Prior to July she was having about 1 period per month.  She recently stopped her OCPs about a month ago and continues to have these heavy periods.  It is a bit strange that she was having 2 periods a month while on OCPs, she does endorse taking these correctly and not missing doses.  We will check TSH and CBC today.  Pregnancy test is negative.  Discussed using alternative OCPs versus progesterone challenge with the patient.  We will order progesterone challenge after discussing further with the patient to see if we can reset her uterine lining with Provera 10 mg daily for 14 days.  She will follow back up with me in 1 month to see how she is doing.

## 2021-05-17 ENCOUNTER — Other Ambulatory Visit: Payer: Self-pay

## 2021-05-17 ENCOUNTER — Encounter (HOSPITAL_COMMUNITY): Payer: Self-pay | Admitting: Emergency Medicine

## 2021-05-17 ENCOUNTER — Ambulatory Visit (HOSPITAL_COMMUNITY)
Admission: EM | Admit: 2021-05-17 | Discharge: 2021-05-17 | Disposition: A | Discharge status: Home / Self Care | Payer: Medicaid Other | Attending: Internal Medicine | Admitting: Internal Medicine

## 2021-05-17 DIAGNOSIS — Z113 Encounter for screening for infections with a predominantly sexual mode of transmission: Secondary | ICD-10-CM | POA: Diagnosis not present

## 2021-05-17 DIAGNOSIS — Z202 Contact with and (suspected) exposure to infections with a predominantly sexual mode of transmission: Secondary | ICD-10-CM | POA: Diagnosis not present

## 2021-05-17 DIAGNOSIS — M79606 Pain in leg, unspecified: Secondary | ICD-10-CM | POA: Diagnosis not present

## 2021-05-17 LAB — VITAMIN D 25 HYDROXY (VIT D DEFICIENCY, FRACTURES): Vit D, 25-Hydroxy: 19.49 ng/mL — ABNORMAL LOW (ref 30–100)

## 2021-05-17 NOTE — ED Provider Notes (Addendum)
[facility]    CSN: [id number] Arrival date & time: 05/17/21  1359      History   Chief Complaint Chief Complaint  Patient presents with   Leg Pain   SEXUALLY TRANSMITTED DISEASE    HPI Melanie Maddox is a 20 y.o. female comes to the urgent care for STD screening.  Patient has no symptoms may have been exposed to STD.  No dysuria, urgency or frequency.  No vaginal discharge.  Patient also complains of bilateral thigh pain.  Pain is intermittent and occurs in a random fashion.  No fever or chills.  No nausea or vomiting.  No generalized joint aches.   HPI  Past Medical History:  Diagnosis Date   Anemia     Patient Active Problem List   Diagnosis Date Noted   Menorrhagia with irregular cycle 05/04/2021   Patellofemoral pain syndrome of both knees 11/11/2020   Acne vulgaris 09/18/2020   Anxiety state 07/09/2020   Sports physical 05/27/2020   Birth control counseling 03/19/2020    History reviewed. No pertinent surgical history.  OB History     Gravida  0   Para  0   Term  0   Preterm  0   AB  0   Living  0      SAB  0   IAB  0   Ectopic  0   Multiple  0   Live Births  0        Obstetric Comments  Has been having having periods since age 58.           Home Medications    Prior to Admission medications   Medication Sig Start Date End Date Taking? Authorizing Provider  Clindamycin-Benzoyl Per, Refr, gel Apply 1 application topically 2 (two) times daily. 09/30/20   Meccariello, Solmon Ice, DO  hydrOXYzine (ATARAX/VISTARIL) 10 MG tablet Take 1 tablet (10 mg total) by mouth 3 (three) times daily as needed. 09/02/20   Meccariello, Solmon Ice, DO  ibuprofen (ADVIL) 400 MG tablet Take 1 tablet (400 mg total) by mouth every 6 (six) hours as needed. 05/13/20   Daana Petrasek, Britta Mccreedy, MD  medroxyPROGESTERone (PROVERA) 10 MG tablet Take 1 tablet (10 mg total) by mouth daily for 14 days. 05/04/21 05/18/21  Jackelyn Poling, DO    Family  History Family History  Problem Relation Age of Onset   Hypertension Mother     Social History Social History   Tobacco Use   Smoking status: Never   Smokeless tobacco: Never  Vaping Use   Vaping Use: Never used  Substance Use Topics   Alcohol use: Never   Drug use: Never     Allergies   Patient has no known allergies.   Review of Systems Review of Systems  Musculoskeletal:  Positive for arthralgias, joint swelling and myalgias.  Skin: Negative.  Negative for color change and wound.    Physical Exam Triage Vital Signs ED Triage Vitals  Enc Vitals Group     BP 05/17/21 1543 116/72     Pulse Rate 05/17/21 1543 95     Resp --      Temp 05/17/21 1543 98.3 F (36.8 C)     Temp Source 05/17/21 1543 Oral     SpO2 05/17/21 1543 98 %     Weight --      Height --      Head Circumference --      Peak Flow --  Pain Score 05/17/21 1551 6     Pain Loc --      Pain Edu? --      Excl. in GC? --    No data found.  Updated Vital Signs BP 116/72 (BP Location: Left Arm)   Pulse 95   Temp 98.3 F (36.8 C) (Oral)   LMP 04/21/2021   SpO2 98%   Visual Acuity Right Eye Distance:   Left Eye Distance:   Bilateral Distance:    Right Eye Near:   Left Eye Near:    Bilateral Near:     Physical Exam Vitals and nursing note reviewed.  Pulmonary:     Effort: Pulmonary effort is normal.     Breath sounds: Normal breath sounds.  Musculoskeletal:        General: No swelling, tenderness, deformity or signs of injury. Normal range of motion.  Skin:    General: Skin is warm.  Neurological:     General: No focal deficit present.     Mental Status: She is alert and oriented to person, place, and time.     UC Treatments / Results  Labs (all labs ordered are listed, but only abnormal results are displayed) Labs Reviewed  VITAMIN D 25 HYDROXY (VIT D DEFICIENCY, FRACTURES)  CERVICOVAGINAL ANCILLARY ONLY    EKG   Radiology No results  found.  Procedures Procedures (including critical care time)  Medications Ordered in UC Medications - No data to display  Initial Impression / Assessment and Plan / UC Course  I have reviewed the triage vital signs and the nursing notes.  Pertinent labs & imaging results that were available during my care of the patient were reviewed by me and considered in my medical decision making (see chart for details).     1.  Leg pain: Vitamin D level Patient is advised to take some multivitamin on a regular basis  2.  Screen for STD: GC/chlamydia/trichomonas We will call you with recommendations if labs are abnormal. Return to urgent care if you have any further concerns. Final Clinical Impressions(s) / UC Diagnoses   Final diagnoses:  Screen for STD (sexually transmitted disease)   Discharge Instructions   None    ED Prescriptions   None    PDMP not reviewed this encounter.   Merrilee Jansky, MD 05/17/21 1640    Merrilee Jansky, MD 05/17/21 [phone]

## 2021-05-17 NOTE — ED Triage Notes (Signed)
Pt reports that he boyfriend having STD symptoms and getting checked so she wants to be checked.  Pt also c/o bilat leg pains that has been ongoing since around age 20.

## 2021-05-17 NOTE — Discharge Instructions (Addendum)
Safe sex practices advised We will call you with recommendations if labs are abnormal Return to urgent care if you have any further symptoms

## 2021-05-18 ENCOUNTER — Other Ambulatory Visit: Payer: Self-pay | Admitting: Family Medicine

## 2021-05-18 LAB — CERVICOVAGINAL ANCILLARY ONLY
Chlamydia: NEGATIVE
Comment: NEGATIVE
Comment: NEGATIVE
Comment: NORMAL
Neisseria Gonorrhea: POSITIVE — AB
Trichomonas: NEGATIVE

## 2021-05-19 ENCOUNTER — Ambulatory Visit (HOSPITAL_COMMUNITY)
Admission: EM | Admit: 2021-05-19 | Discharge: 2021-05-19 | Disposition: A | Discharge status: Home / Self Care | Payer: Medicaid Other

## 2021-05-19 ENCOUNTER — Ambulatory Visit
Admission: EM | Admit: 2021-05-19 | Discharge: 2021-05-19 | Disposition: A | Discharge status: Home / Self Care | Payer: Medicaid Other | Attending: Emergency Medicine | Admitting: Emergency Medicine

## 2021-05-19 ENCOUNTER — Other Ambulatory Visit: Payer: Self-pay

## 2021-05-19 DIAGNOSIS — A549 Gonococcal infection, unspecified: Secondary | ICD-10-CM

## 2021-05-19 MED ORDER — CEFTRIAXONE SODIUM 500 MG IJ SOLR
500.0000 mg | Freq: Once | INTRAMUSCULAR | Status: AC
  Administered 2021-05-19: 500 mg via INTRAMUSCULAR

## 2021-05-19 NOTE — ED Notes (Signed)
Patient did on my way for wrong location, will not let us sign her in at [facility] urgent care.  Moved to [facility] and will d/c

## 2021-05-19 NOTE — Discharge Instructions (Addendum)
Patient presents today for treatment of STD. Please refrain from sexual activity for the next week.

## 2021-05-19 NOTE — ED Triage Notes (Signed)
Patient presents today for treatment of Gonorrhea.

## 2021-06-02 ENCOUNTER — Encounter: Payer: Self-pay | Admitting: Family Medicine

## 2021-06-02 ENCOUNTER — Ambulatory Visit (INDEPENDENT_AMBULATORY_CARE_PROVIDER_SITE_OTHER): Payer: Medicaid Other | Admitting: Family Medicine

## 2021-06-02 ENCOUNTER — Other Ambulatory Visit: Payer: Self-pay

## 2021-06-02 VITALS — BP 105/73 | HR 72 | Ht 67.0 in | Wt 103.6 lb

## 2021-06-02 DIAGNOSIS — Z23 Encounter for immunization: Secondary | ICD-10-CM | POA: Diagnosis not present

## 2021-06-02 DIAGNOSIS — F411 Generalized anxiety disorder: Secondary | ICD-10-CM

## 2021-06-02 DIAGNOSIS — Z3009 Encounter for other general counseling and advice on contraception: Secondary | ICD-10-CM | POA: Diagnosis not present

## 2021-06-02 LAB — POCT URINE PREGNANCY: Preg Test, Ur: NEGATIVE

## 2021-06-02 MED ORDER — NORGESTIMATE-ETH ESTRADIOL 0.25-35 MG-MCG PO TABS: 1.0000 | ORAL_TABLET | Freq: Every day | ORAL | 11 refills | Status: DC

## 2021-06-02 NOTE — Patient Instructions (Signed)
It was wonderful seeing you today.  I am glad you are doing well and your bleeding has improved.  I have restarted your birth control pills and sent a prescription to your pharmacy.  If you decide you want another method of birth control please let [place] know.  I hope you have a wonderful afternoon!

## 2021-06-02 NOTE — Progress Notes (Addendum)
    SUBJECTIVE:   CHIEF COMPLAINT / HPI:   Contraceptive counseling  Patient reports that she has been doing well since last being seen for abnormal uterine bleeding.  She finished her 14 days of progesterone and has not had a period until this previous Sunday.  Bleeding started on Sunday and stopped on Tuesday.  She is not currently bleeding.  No abnormal discharge.  She is interested in restarting her birth control.  She has not been sexually active since her previous visit.   OBJECTIVE:   BP 105/73   Pulse 72   Ht 5\' 7"  (1.702 m)   Wt 103 lb 9.6 oz (47 kg)   LMP 05/30/2021 (Exact Date)   BMI 16.23 kg/m   General: Well-appearing 20 year old female, no acute distress Cardiac: Regular rate and rhythm, no murmurs appreciated Respiratory: Normal work of breathing, lungs clear to auscultation bilaterally Abdomen: Soft, nontender, positive bowel sounds MSK: No gross abnormalities Psych: Happy, denies SI or HI.  Patient marked 1 on question #9 of PHQ-9.  She reports that "a while ago" she had issues with her family and she had thoughts that she would be better off not being alive but has not thought that recently.  Denies any thoughts of SI or plans.  Reports that she misunderstood the question.   PHQ9 SCORE ONLY 06/02/2021 05/04/2021 01/12/2021  PHQ-9 Total Score 10 11 10     ASSESSMENT/PLAN:   Birth control counseling Patient was happy with previous OCP regimen.  She is hoping that the doses of progesterone have reset her cycle.  Would like to restart OCPs.  Sprintec refilled for the patient.  Discussed other contraceptive methods but patient has not interested at this time.  Anxiety state Doing well at this time.  Has worked through issues with her family and feels like they are in a good place.  No SI or HI at this time.     03/14/2021, MD [facility]

## 2021-06-03 NOTE — Assessment & Plan Note (Signed)
Doing well at this time.  Has worked through issues with her family and feels like they are in a good place.  No SI or HI at this time.

## 2021-06-03 NOTE — Assessment & Plan Note (Addendum)
Patient was happy with previous OCP regimen.  She is hoping that the doses of progesterone have reset her cycle.  Would like to restart OCPs.  Sprintec refilled for the patient.  Discussed other contraceptive methods but patient has not interested at this time.

## 2021-07-08 NOTE — Progress Notes (Signed)
    SUBJECTIVE:   CHIEF COMPLAINT / HPI:   Vaginal Discharge: Patient is a 20 y.o. female presenting with vaginal discharge for 3-4 days.  She states the discharge is of greenish consistency.  She endorses no vaginal odor but states she has had some vaginal itching..  She is interested in screening for sexually transmitted infections today.  She is not interested in screening for HIV or RPR today.  Patient is also due for Pap smear so we will plan to do that today as well.  PERTINENT  PMH / PSH: None relevant  OBJECTIVE:   BP 111/83   Pulse 79   Ht 5\' 7"  (1.702 m)   Wt 103 lb 6.4 oz (46.9 kg)   LMP 06/18/2021   SpO2 100%   BMI 16.19 kg/m    General: NAD, pleasant, able to participate in exam Respiratory: Normal effort, no obvious respiratory distress Pelvic: VULVA: normal appearing vulva with no masses, tenderness or lesions, VAGINA: Normal appearing vagina with normal color, no lesions, with copious and green discharge present, CERVIX: No lesions  Chaperone April present for pelvic exam  ASSESSMENT/PLAN:   Vaginal discharge 20 y.o. female with vaginal discharge for 4days, as well as vaginal itching.  Physical exam significant for copious greenish discharge.  Will send for BV/Candida/trichomonas/GC/chlamydia testing.  Offered HIV/RPR testing initially but patient declined.  Patient is interested in STI screening.   Plan: -Awaiting send out for trichomonas/BV/Candida -GC/chlamydia pending  Initial Pap smear performed  35, DO [facility]

## 2021-07-09 ENCOUNTER — Other Ambulatory Visit (HOSPITAL_COMMUNITY)
Admission: RE | Admit: 2021-07-09 | Discharge: 2021-07-09 | Disposition: A | Discharge status: Home / Self Care | Payer: Medicaid Other | Source: Ambulatory Visit | Attending: Family Medicine | Admitting: Family Medicine

## 2021-07-09 ENCOUNTER — Other Ambulatory Visit: Payer: Self-pay

## 2021-07-09 ENCOUNTER — Ambulatory Visit (INDEPENDENT_AMBULATORY_CARE_PROVIDER_SITE_OTHER): Payer: Medicaid Other | Admitting: Family Medicine

## 2021-07-09 VITALS — BP 111/83 | HR 79 | Ht 67.0 in | Wt 103.4 lb

## 2021-07-09 DIAGNOSIS — Z124 Encounter for screening for malignant neoplasm of cervix: Secondary | ICD-10-CM | POA: Insufficient documentation

## 2021-07-09 DIAGNOSIS — N898 Other specified noninflammatory disorders of vagina: Secondary | ICD-10-CM | POA: Diagnosis not present

## 2021-07-12 LAB — CYTOLOGY - PAP
Chlamydia: NEGATIVE
Comment: NEGATIVE
Comment: NEGATIVE
Comment: NORMAL
Diagnosis: NEGATIVE
Neisseria Gonorrhea: NEGATIVE
Trichomonas: NEGATIVE

## 2021-07-12 LAB — CERVICOVAGINAL ANCILLARY ONLY
Bacterial Vaginitis (gardnerella): NEGATIVE
Candida Glabrata: NEGATIVE
Candida Vaginitis: NEGATIVE
Chlamydia: NEGATIVE
Comment: NEGATIVE
Comment: NEGATIVE
Comment: NEGATIVE
Comment: NEGATIVE
Comment: NORMAL
Neisseria Gonorrhea: NEGATIVE

## 2021-07-13 ENCOUNTER — Encounter: Payer: Self-pay | Admitting: Family Medicine

## 2021-08-18 ENCOUNTER — Telehealth: Payer: Self-pay | Admitting: Family Medicine

## 2021-08-18 ENCOUNTER — Encounter: Payer: Self-pay | Admitting: Family Medicine

## 2021-08-18 ENCOUNTER — Ambulatory Visit: Payer: Medicaid Other | Admitting: Family Medicine

## 2021-08-18 ENCOUNTER — Other Ambulatory Visit: Payer: Self-pay

## 2021-08-18 VITALS — BP 109/76 | Ht 67.0 in | Wt 106.5 lb

## 2021-08-18 DIAGNOSIS — R1033 Periumbilical pain: Secondary | ICD-10-CM

## 2021-08-18 DIAGNOSIS — Z309 Encounter for contraceptive management, unspecified: Secondary | ICD-10-CM | POA: Diagnosis not present

## 2021-08-18 LAB — POCT URINE PREGNANCY: Preg Test, Ur: NEGATIVE

## 2021-08-18 NOTE — Patient Instructions (Signed)
It was nice seeing you. Your belly button looks good. Let us keep an eye on it. Please call soon if symptoms persist. We will check your urine pregnancy test today. If negative, please restart your birth control today and follow-up soon with PCP for birth control discussion.

## 2021-08-18 NOTE — Telephone Encounter (Signed)
Neg upreg discussed with the patient and I have advised her to restart her OCP and f/u soon with PCP for meds adjustment. She agreed with the plan.

## 2021-08-18 NOTE — Progress Notes (Signed)
° ° °  SUBJECTIVE:   CHIEF COMPLAINT / HPI:   Belly button pain: C/O pain in her belly button, which started about 1-2 weeks ago. Occasionally itchy and clear discharge. The last episode was three days ago. She has felt better from that standpoint since then. She denies any other GI symptoms. BM is at least three times weekly with no blood in her stool.  Contraceptive management: She got off her birth control 2 weeks ago due to nausea and not feeling well. Now she is concern she might be pregnant. She has had unprotected sex since she d/ced her birth control 2 weeks ago. Her LMP: 08/01/21, regular period, but sometimes irregularly.   PERTINENT  PMH / PSH: PHX reviewed.  OBJECTIVE:   BP 109/76    Ht 5\' 7"  (1.702 m)    Wt 106 lb 8 oz (48.3 kg)    BMI 16.68 kg/m   Physical Exam Vitals and nursing note reviewed.  Cardiovascular:     Rate and Rhythm: Normal rate and regular rhythm.     Heart sounds: Normal heart sounds. No murmur heard. Pulmonary:     Effort: Pulmonary effort is normal. No respiratory distress.     Breath sounds: Normal breath sounds. No wheezing.  Abdominal:     General: Abdomen is flat. Bowel sounds are normal. There is no distension.     Palpations: Abdomen is soft. There is no mass.     Tenderness: There is no abdominal tenderness.     Comments: Normal umbilicus exam, no swelling, discharge, erythema or tenderness.     ASSESSMENT/PLAN:   Umbilical pain. Normal GI exam. Currently asymptomatic x 3 days. Monitor closely fr now with strict return precaution. She agreed with the plan.  Contraceptive management. Upreg negative. I advised her to resume her OCP right away. She has some at home with refills. F/U appointment made with PCP to review her contraceptive plan. She agreed with the plan.      , MD [facility]

## 2021-08-29 ENCOUNTER — Other Ambulatory Visit: Payer: Self-pay | Admitting: Family Medicine

## 2021-08-29 DIAGNOSIS — F411 Generalized anxiety disorder: Secondary | ICD-10-CM

## 2021-08-30 MED ORDER — HYDROXYZINE HCL 10 MG PO TABS: 10.0000 mg | ORAL_TABLET | Freq: Three times a day (TID) | ORAL | 0 refills | Status: DC | PRN

## 2021-09-16 ENCOUNTER — Other Ambulatory Visit: Payer: Self-pay

## 2021-09-16 ENCOUNTER — Encounter: Payer: Self-pay | Admitting: Student

## 2021-09-16 ENCOUNTER — Ambulatory Visit (INDEPENDENT_AMBULATORY_CARE_PROVIDER_SITE_OTHER): Payer: Medicaid Other | Admitting: Student

## 2021-09-16 DIAGNOSIS — Z309 Encounter for contraceptive management, unspecified: Secondary | ICD-10-CM | POA: Diagnosis not present

## 2021-09-16 LAB — POCT URINE PREGNANCY: Preg Test, Ur: NEGATIVE

## 2021-09-16 MED ORDER — MEDROXYPROGESTERONE ACETATE 150 MG/ML IM SUSP
150.0000 mg | Freq: Once | INTRAMUSCULAR | Status: AC
  Administered 2021-09-16: 150 mg via INTRAMUSCULAR

## 2021-09-16 NOTE — Progress Notes (Addendum)
° ° °  SUBJECTIVE:   CHIEF COMPLAINT / HPI: Contraceptive management  Patient wants to switch to the Depo-Provera birth control due to  concern decreased appetite, and increased fatigue from oral contraceptive. She is sexually active and currently on Sprintec for birth control.  LMP was 09/04/2021.  She reports having been unable and this is common for her around the start of her period.  She denies any headache, nausea, vomiting recently.  PERTINENT  PMH / PSH: Anxiety  OBJECTIVE:   BP 107/71    Pulse 87    Ht 5\' 7"  (1.702 m)    Wt 106 lb 9.6 oz (48.4 kg)    LMP 09/04/2021    SpO2 98%    BMI 16.70 kg/m     Physical Exam General: Alert, well appearing, NAD,  Cardiovascular: RRR, No Murmurs, Normal S2/S2 Respiratory: CTAB Abdomen: No distension or tenderness Extremities: No edema on extremities   Skin: Warm and dry  ASSESSMENT/PLAN:   Encounter for contraceptive management Patient's pregnancy test was negative.  LMP 09/04/2021.  Currently on Sprintec and complaining of decreased appetite and increased fatigue.  Received Depo-Provera 150 mg IM shots today.  Reviewed side effects with patient and recommend continuing Sprintec for 7 days and then discontinue the pills.  Patient verbalized understanding and amenable to plan.     09/06/2021, MD [facility]

## 2021-09-16 NOTE — Assessment & Plan Note (Addendum)
Patient's pregnancy test was negative.  LMP 09/04/2021.  Currently on Sprintec and complaining of decreased appetite and increased fatigue.  Received Depo-Provera 150 mg IM shots today.  Reviewed side effects with patient and recommend continuing Sprintec for 7 days and then discontinue the pills.  Patient verbalized understanding and amenable to plan.

## 2021-09-16 NOTE — Patient Instructions (Addendum)
It was wonderful to meet you today. Thank you for allowing me to be a part of your care. Below is a short summary of what we discussed at your visit today:  Your pregnancy test was Negative  Received 2 of the previous shots today.  I recommend continuing your sprintec checks for 7 another days and can discontinue afterwards.    If you have any questions or concerns, please do not hesitate to contact us via phone or MyChart message.   Jerre Simon, MD [facility]

## 2021-09-16 NOTE — Progress Notes (Deleted)
° ° °  SUBJECTIVE:   CHIEF COMPLAINT / HPI: Contraceptive management  Patient said she will like to switch to the Depo-Provera birth control due to p.o.'s concern decreased appetite, and increased fatigue.She is sexually active and currently on Sprintec for birth control.  LMP was 09/04/2021.  She reports having been unable and this is common for her around the start of her period.  She denies any headache, nausea, vomiting recently.  PERTINENT  PMH / PSH: ***  OBJECTIVE:   BP 107/71    Pulse 87    Ht 5\' 7"  (1.702 m)    Wt 106 lb 9.6 oz (48.4 kg)    LMP 09/04/2021    SpO2 98%    BMI 16.70 kg/m     Physical Exam General: Alert, well appearing, NAD, Oriented x4 Cardiovascular: RRR, No Murmurs, Normal S2/S2 Respiratory: CTAB, No wheezing or Rales Abdomen: No distension or tenderness Extremities: No edema on extremities   Skin: Warm and dry  ASSESSMENT/PLAN:   No problem-specific Assessment & Plan notes found for this encounter.     Melanie Bleacher, MD [place]   {    This will disappear when note is signed, click to select method of visit    :1}

## 2021-09-27 IMAGING — CR DG KNEE COMPLETE 4+V*L*
4 series · 4 of 4 positions shown · non-contrast
Comparison: None.

CLINICAL DATA: Bilateral knee pain.

EXAM:
LEFT KNEE - COMPLETE 4+ VIEW; RIGHT KNEE - COMPLETE 4+ VIEW

[w knee ap left]
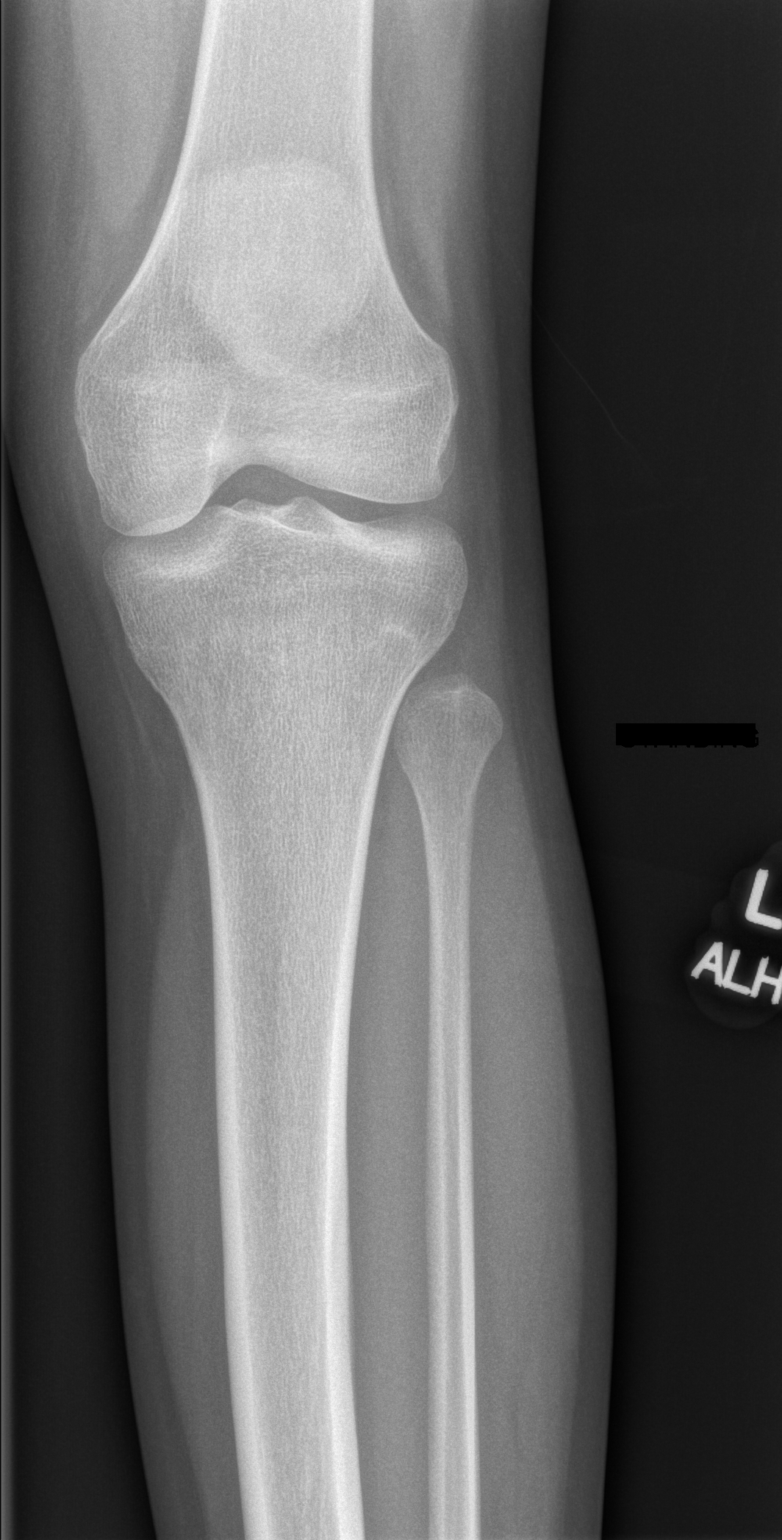

[w knee lat left]
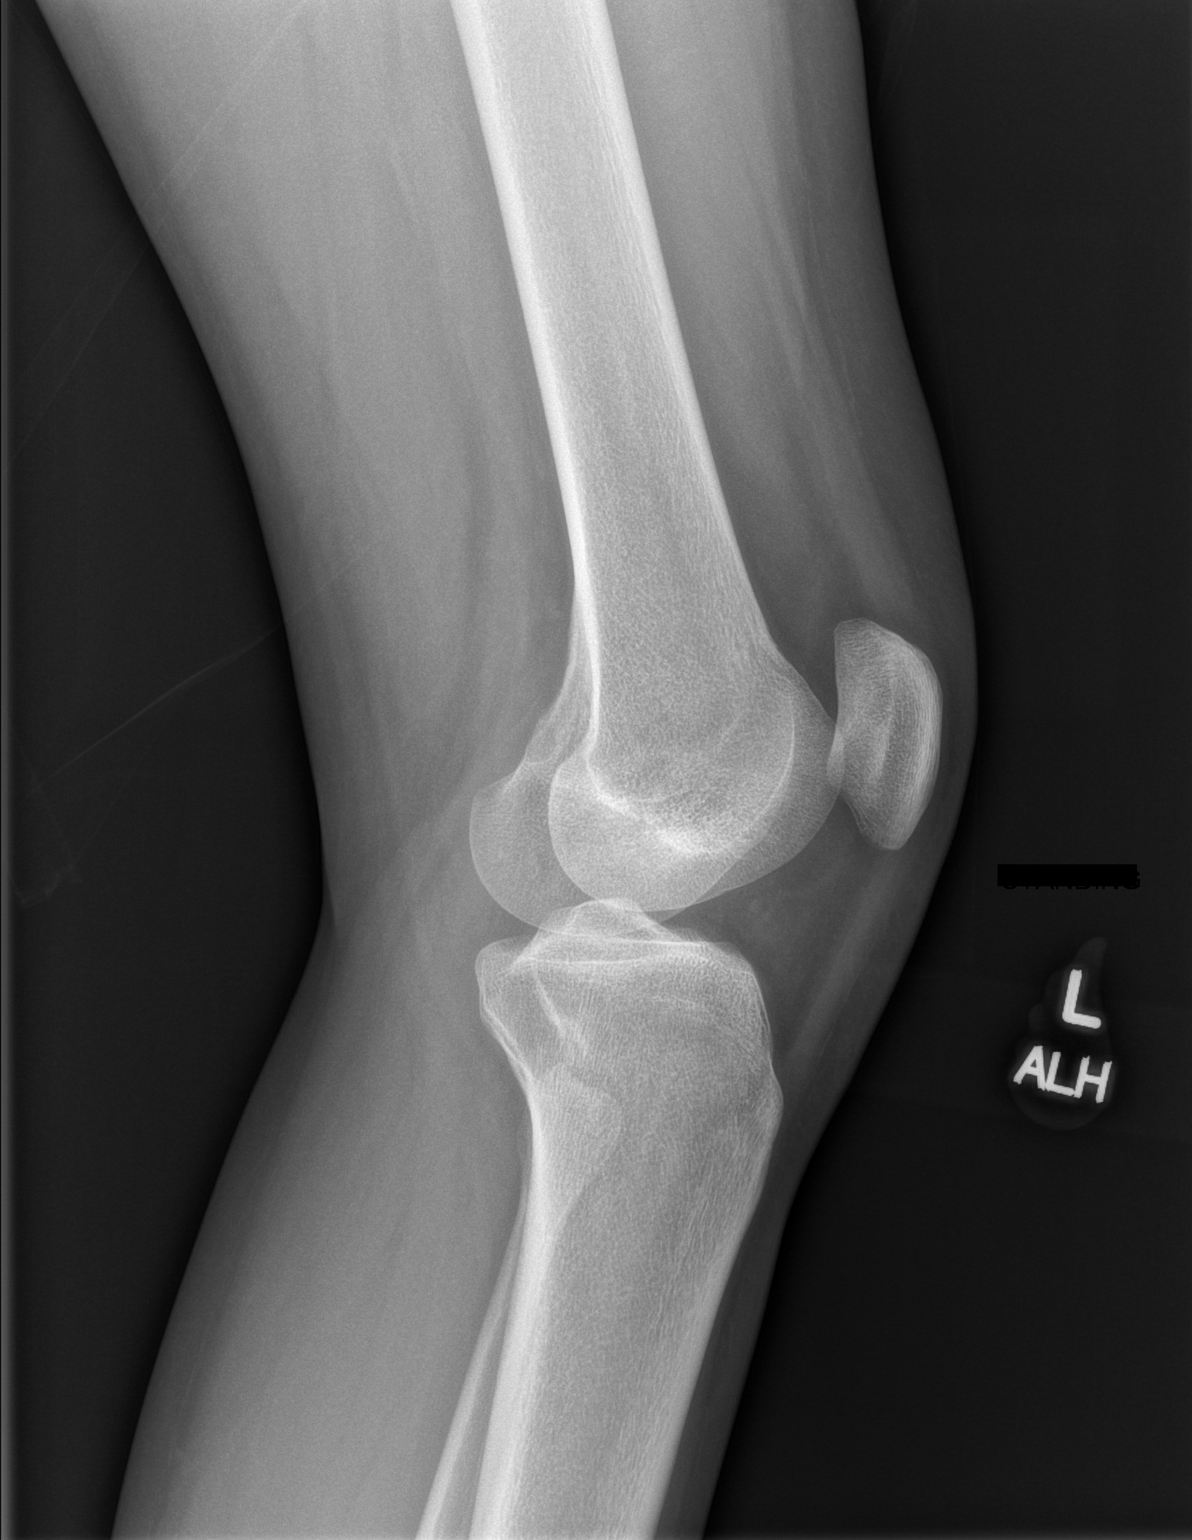

[w knee tunnel pa left]
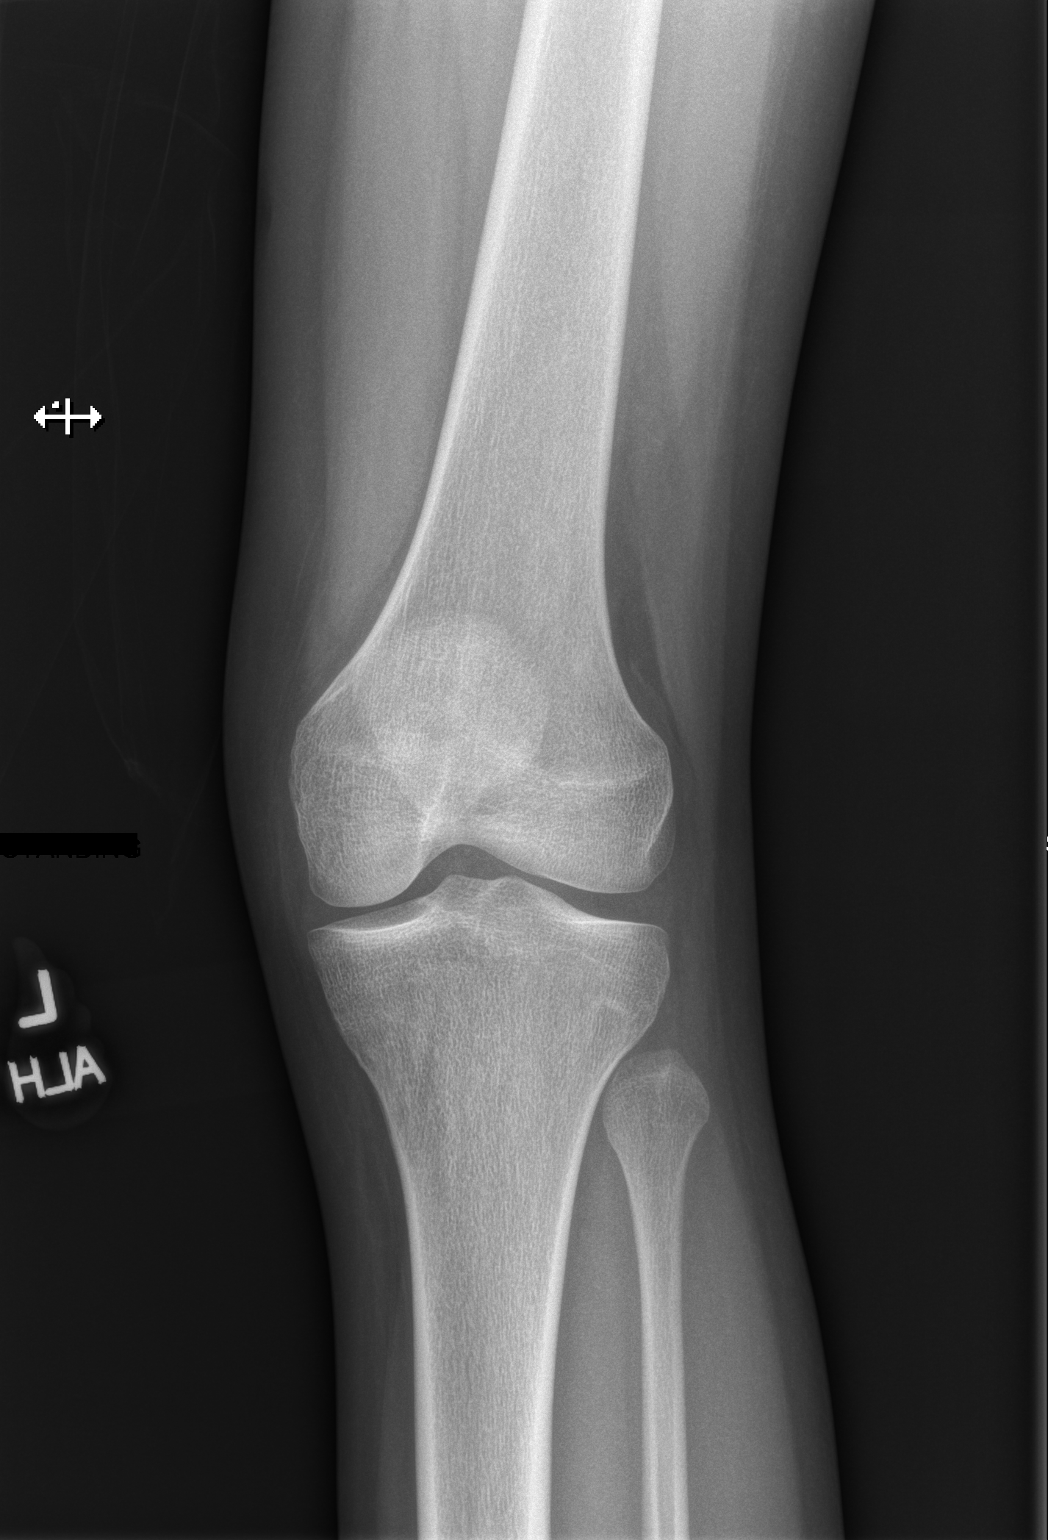

[x knee sunrise left]
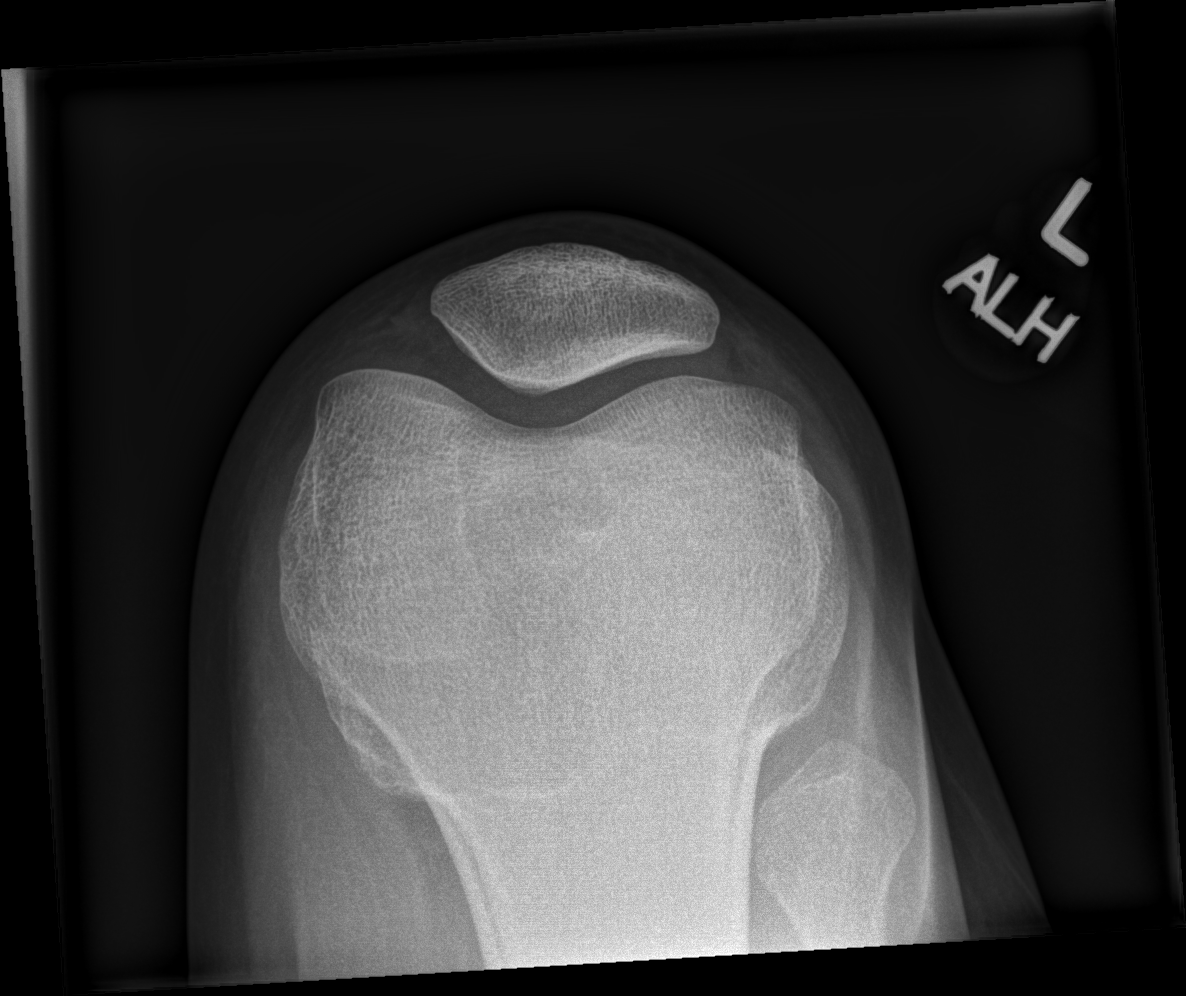

[4 of 4 positions shown; findings below may reference images not displayed]

FINDINGS: No evidence of fracture, dislocation, or joint effusion. No evidence
of arthropathy or other focal bone abnormality. Soft tissues are
unremarkable.
IMPRESSION: Negative.

## 2021-10-14 ENCOUNTER — Ambulatory Visit (INDEPENDENT_AMBULATORY_CARE_PROVIDER_SITE_OTHER): Payer: Medicaid Other | Admitting: Family Medicine

## 2021-10-14 ENCOUNTER — Other Ambulatory Visit: Payer: Self-pay

## 2021-10-14 VITALS — BP 104/86 | HR 87 | Wt 108.2 lb

## 2021-10-14 DIAGNOSIS — R040 Epistaxis: Secondary | ICD-10-CM | POA: Diagnosis not present

## 2021-10-14 NOTE — Progress Notes (Signed)
? ? ?  SUBJECTIVE:  ? ?CHIEF COMPLAINT / HPI:  ? ?Patient presents recurrent nose bleeds that occur after she is in the shower for a while. This has been ongoing for 2 weeks, she shares that this occurred before when she was young but has not had this occur for at least 5 years before these symptoms started. This does not occur with every shower, since it has started it has occurred about 4 times. It does not occur at any other times. She tries to wash her face when this happens to get the bleeding to resolve. Denies any other changes. Denies any significant hematologic personal or family history. Endorsing regular menstrual cycles, currently on depo for birth control.  ? ?OBJECTIVE:  ? ?BP 104/86   Pulse 87   Wt 108 lb 4 oz (49.1 kg)   LMP 10/07/2021   SpO2 100%   BMI 16.95 kg/m?   ?General: Patient well-appearing, in no acute distress. ?HEENT: PERRLA, normal nasal cavity, normal buccal mucosa ?CV: RRR, no murmurs or gallops auscultated ?Resp: CTAB ?Psych: mood appropriate pleasant  ? ?ASSESSMENT/PLAN:  ? ?Epistaxis ?-from patient's history seems to be minimal bleeding, discussed that this is likely due to vessel dilation which can cause epistaxis ?-prior CBC wnl and normal menstrual cycles ?-reassurance provided, instructions provided on caring for nose bleeds ?-follow up with PCP as appropriate  ?  ?-PHQ-9 score of 5 with negative question 9 reviewed.  ? ?Reece Leader, DO ?[facility]  ?

## 2021-10-14 NOTE — Assessment & Plan Note (Signed)
-  from patient's history seems to be minimal bleeding, discussed that this is likely due to vessel dilation which can cause epistaxis ?-prior CBC wnl and normal menstrual cycles ?-reassurance provided, instructions provided on caring for nose bleeds ?-follow up with PCP as appropriate  ?

## 2021-10-14 NOTE — Patient Instructions (Signed)
It was great seeing you today! ? ?Today we discussed your nose bleeds, this can occur while or after a shower as the warmth and steam can dilate the vessels causing bleeding. When this occurs, please do not blow your nose. Tilt your head back until the bleeding stops. Please limit ibuprofen consumption as well as this can sometimes cause bleeding.  ? ?Please follow up at your next scheduled appointment, if anything arises between now and then, please don't hesitate to contact our office. ? ? ?Thank you for allowing [place] to be a part of your medical care! ? ?Thank you, ?Dr. Robyne Peers  ?

## 2021-10-21 ENCOUNTER — Telehealth: Payer: Self-pay | Admitting: Student

## 2021-10-21 NOTE — Progress Notes (Signed)
?SUBJECTIVE:  ? ?CHIEF COMPLAINT / HPI:  ? ?Nose bleeds and back pain ? ?Back pain: ?Hurts in middle of upper and lower back. No issues with range of motion. Pain described as dull achey, for 2 months. Resting helps, standing for long time, makes back hurt. Sometimes using tylenol helps but last only 6-8 hours. Back pain is constant/every day.  ? ?Nose Bleeds ?Patient seen 10/14/21 for nose bleeds, reassurance was provided. Happens every day, when washing face or blowing nose. Has been happening for 3 weeks. Can be holding nose for bleeding as long as half hour. Usually takes 10-15 minutes. Some headaches when it bleeds a lot. Sometimes feels dizzy and sits down until it goes away. No pain, and 3 days of nasal congestion in a week. Sometimes uses a tissue to stop bleeding, but it doesn't help. Easily bruised. Still gets periods and are normal amount, using 3 pads in a day.  ? ?Depression: ?Down on the weekends and feeling alone, not talking to people. Feels better when at work. Sometiems wanting to cry. No SI/HI, or thoughts of worthlessness.  ? ?BMI 17.20. ?Been trying to gain weight for about a year. Feels like she's at a low weight. Tries to eat a lot, but working. Appetite issues, not feeling hungry, eating once in a day.  ? ? ?PERTINENT  PMH / PSH: anemia, menorrhagia ? ?OBJECTIVE:  ?LMP 10/07/2021  ? ?General: NAD, pleasant, able to participate in exam, thin ?Cardiac: RRR, no murmurs auscultated. ?Respiratory: CTAB, normal effort, no wheezes, rales or rhonchi ?Nose/Throat: Pink mucosa, turbinate non edematous, no lesions, blood, drainage, no erythema ?Abdomen: soft, non-tender, non-distended, normoactive bowel sounds ?Extremities: warm and well perfused, no edema or cyanosis. ?Skin: warm and dry, no rashes noted ?Neuro: alert, no obvious focal deficits, speech normal ?Psych: Normal affect and mood ?Back: No TTP overlying spine, or paraspinal muscles, ROM intact, bulk symmetric ? ?ASSESSMENT/PLAN:  ? ?Back  Pain: ?Back pain located in upper thoracic and lumbar. It appears muscular in nature, with no issues in ROM, or TTP overlying muscles or bones. Issues presents after periods of standing, and go away with rest/sitting. Pain also responsive to Tylenol. No burning/numbness for neuropathic/radicular pain. No joint tenderness or bone pain/ pain with movement suggesting no skeletal component. No muscle TTP, with full ROM suggesting no concern for strained or spasmodic muscles. Recommend patient try topical pain relief and stretches/exercises.  ?-Salonpas patches/volatren gel ?-Cont Tylenol ?-Consider Ibuprofen ?-Deep stretches/yoga like exercise ? ?Nose Bleeds: ?Patient continues to have nose bleeds, that resolve after 10-15 min. Patient with no history of trauma, and normal appearing naris on exam (no blood, erythema, mass, drainage, lesion). Given normal exam, less concerned for lesion/injury, mass/growth. Etiology may be due to dry air/nasal mucousa/allergies. Encouraged patient to try humidifier and NaCl nasal spray. Encouraged her to follow up in 2-4 wks, if this issue continues. ?-NaCl nasal spray OTC ?-Humidifier in room ? ? ?Depression: ?Patient screens for mild depression today and admits feeling down and distant from others. No thoughts of harming self or others, no SI. Patient is open to therapy, therapy resources were provided in AVS. ?-Therapy resources ? ? ?BMI 17.2: ?Patient BMI concerningly low, patient appears thin on exam. Informed patient and consoled her on reaching out to RD on staff, Dr. Gerilyn Pilgrim, for help with diet and gaining weight. Patient reports she has a poor appetite, and has attempted to gain weight in the past. Also discussed risk to health of low BMI (Menstrual cycle, bone,  heart).  ?-Referral Dr. Gerilyn Pilgrim for RD ? ?@SIGNNOTE @ ? ?

## 2021-10-22 ENCOUNTER — Ambulatory Visit: Payer: Medicaid Other | Admitting: Student

## 2021-10-22 ENCOUNTER — Other Ambulatory Visit: Payer: Self-pay

## 2021-10-22 ENCOUNTER — Encounter: Payer: Self-pay | Admitting: Student

## 2021-10-22 VITALS — BP 104/75 | HR 76 | Ht 67.0 in | Wt 109.8 lb

## 2021-10-22 DIAGNOSIS — R636 Underweight: Secondary | ICD-10-CM | POA: Diagnosis not present

## 2021-10-22 DIAGNOSIS — R040 Epistaxis: Secondary | ICD-10-CM

## 2021-10-22 DIAGNOSIS — F320 Major depressive disorder, single episode, mild: Secondary | ICD-10-CM

## 2021-10-22 DIAGNOSIS — M549 Dorsalgia, unspecified: Secondary | ICD-10-CM

## 2021-10-22 NOTE — Patient Instructions (Signed)
It was great to see you! Thank you for allowing me to participate in your care! ? ?I recommend that you always bring your medications to each appointment as this makes it easy to ensure we are on the correct medications and helps [place] not miss when refills are needed. ? ?Our plans for today:  ?- Back Pain: Seems muscular, recommend stretching exercises and topical pain patches or over the counter pain meds ? Try: Salonpas patches, Voltaren cream/gel ?Continue using Tylenol, re-dose as needed for pain, (Max 3,000 mg in a day). Consider ibuprofen as well, (Max 2400 mg in a day). ?- Nose bleeds: Recommend using a humidifier and saline nasal spray.  ? If continues follow up in 2-4 wk ?-BMI 17: Your weight is too low, and puts you at risk for problems. I'd like you to follow up with our Dietician, Dr. Wyona Almas ? Schedule appointment with Dr. Gerilyn Pilgrim: [phone] ?-Depression: ? You screened positive for low grade depression I recommend you try therapy. ? Therapy resources listed below! ? ? ?Take care and seek immediate care sooner if you develop any concerns.  ? ?Dr. Bess Kinds, MD ?[facility] ? ? ? ?Therapy and Counseling Resources ?Most providers on this list will take Medicaid. Patients with commercial insurance or Medicare should contact their insurance company to get a list of in network providers. ? ?[facility] (spanish speaking therapist available)(habla espanol)  ?[address], [place], [place] [address], [place] ?[email or web address] ?[phone] ? ?[facility] ?[address] [place], [place] [address] ?[phone] ? ?[facility] ? [address] Suite [place], [place] [address]      [phone] ? ?[facility] ?[address]  [place], [place] [address] ?[phone] ? ?[facility] ?[address]  [place], [place] [address]       [phone]    ? ?[facility] (virtual only) ?[phone] ? ?[facility] ?[address] [place], [place] [phone] ? ?[facility]:  [address] [place] [phone] ? ?[facility]:  ?Coralie Carpen [phone] ? ?[facility] (under & uninsured) ?[address]   [place] [phone]    [email or web address]   ? ?[facility] ?[address]  [place]    [phone] ? ?[facility] ?[facility] [address]     Phone:  [phone]     [facility]-  [address]  [phone]  ? ?[facility] ?[address] [place], [place] [phone] ? ?[facility]: [address] [place], [place], [place]  [phone]  ? ?[facility] (Spanish therapist) [email or web address]  ?[address] [place]  [address]   [place] [address]    [phone]   ? ?[facility]   ?[address],  [place], [place]  [phone]  ? ?[facility]  ?[address] [place]  [phone] ? ?[facility]  ?[address] [place], [place]        [phone] ? ?Open Access/Walk In Clinic under & uninsured ? ?[facility]  ?[address] [place], [place] ?[place] [phone] ?Crisis [phone] ? ?[facility] [address],  ?(Spanish)   [address], [place]: [phone]) 8:30 - 12; 1 - 2:30 ? ?[facility] [place] [employer],  ?  [address] [place]    [phone]):8:30 - 12; 2 - 3PM ? ?[facility] [employer],  ?[address],  [place]; [phone]):   Mon - Fri 8 AM - 5 PM ? ?[facility] ?[address] [place]  MWF 12:30 to 3:00 or call to schedule an appointment  [phone] ? ?Specific Provider options ?Psych[facility] Today  [email or web address]/us ?click on find a therapist  ?enter your zip code ?left side and select or tailor a therapist  for your specific need.  ? ?[facility] Provider Directory ?[email or web address]  (Medicaid)   Follow all drop down to find a provider ? ?Social Support program ?[facility] ?336) I7437963 or PhotoSolver.pl ?[address], [place], [facility] support and educational  ? ?24- Hour Availability:  ? ?[facility]  ?[address] [place], [place] ?[place] [phone] ?Crisis [phone] ? ?[facility] [phone] ? ?[employer] [facility]  [phone]  ? ?[facility] [employer]  [phone] (after hours) ? ?Therapeutic Alternative/Mobile Crisis   [phone] ? ?[place] National Suicide Hotline  5141091725 Len Childs) ? ?Call 911 or go to emergency room ? ?[facility]  [phone]);  [place] and [place]  ? ?[facility]  ?[phone]); [place], [place], [place], [place], [place], [place], [place] ? ?

## 2021-10-26 DIAGNOSIS — R636 Underweight: Secondary | ICD-10-CM | POA: Insufficient documentation

## 2021-10-26 DIAGNOSIS — F320 Major depressive disorder, single episode, mild: Secondary | ICD-10-CM | POA: Insufficient documentation

## 2021-11-08 NOTE — Telephone Encounter (Signed)
Opened note in error.

## 2021-11-18 NOTE — Progress Notes (Signed)
Medical Nutrition Therapy ?PCP Jerre Simon, MD ?Appt start time: 1100 end time: 1200 (1 hour) ?Primary concerns today:  Underweight (R63.6) .  ? ?Relevant history/background: Ms. Amy was referred by Barbaraann Faster, MD for inability to gain wt.  On 10/22/21: wt was 109.8 lb, ht 67", and BMI 17.2.  ? ?Assessment:  Niveah has not been able to gain weight despite efforts.  She works at [employer] 4 PM to 2 AM Monday through Thursday.  She lives with her boyfriend's family, which includes his mom and 4 siblings, ages 74, 10, and 41 YO, and 89.  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  She often misses breakfast before work, and lunch time is 7:30 PM, but she is not always hungry.  On non-work days, she usually wakes up a couple hours later, which means starting eating later those days.  Her appetite has decreased in the couple of months, and she experiences early satiety.   ? ?Learning Readiness: Ready; frustrated that "nothing works." ? ?Usual eating pattern: 2 meals and ~3 snacks per day. ?Frequent foods and beverages: water; chx, beans, fruit, eggs.   ?Avoided foods: milk & cheese (feels nauseated), most veg's.   ?Weight: 109.4 lb  ?Usual physical activity: none other than significant amt of walking at work. ?Sleep: Estimates she averages 6-9 hours per night.  Always tired when she wakes.  Sometimes can't sleep, including staying awake all night about once a month.  Usual bedtime is 4 AM; up at 1 PM on workdays.  On non-workdays, usually gets to bed 3 or 4 AM, sometimes wakes ~10 AM.   ? ?24-hr recall suggests intake of ~1350 kcal: ?(Up at 4 PM; went to sleep ~6 AM) ?B (5:30 PM)-  1 c fried potatoes, 2 meatballs, 3 slc bread, water  ?Snk ( AM)-   water ?L (11 PM)-  1 c fried potatoes, 2 meatballs, 2 slc bread, water  ?Snk (1 AM)-  1 c noodles, 2 boiled eggs, water ?D ( PM)-  --- ?Went to bed 3:30 AM today ?Snk ( PM)-  --- ?Typical day? Yes.  For a  non-work day.    ? ?Nutritional Diagnosis:  ?NI-1.4 Inadequate energy intake As related to poor appetite.  As evidenced by 24-hr recall indicating intake of ~1350 kcal. ? ?Handouts given during visit include: ?After-Visit Summary (AVS) ? ?Demonstrated degree of understanding via:  Teach Back  ?Barriers to learning/adherence to lifestyle change: Poor appetite and early satiety.  ? ?Monitoring/Evaluation:  Dietary intake, exercise, and body weight in 4 week(s). ?  ?

## 2021-11-22 ENCOUNTER — Ambulatory Visit (INDEPENDENT_AMBULATORY_CARE_PROVIDER_SITE_OTHER): Payer: Medicaid Other | Admitting: Family Medicine

## 2021-11-22 DIAGNOSIS — R636 Underweight: Secondary | ICD-10-CM | POA: Diagnosis not present

## 2021-11-22 NOTE — Patient Instructions (Addendum)
-   When you start working part-time, aim for a consistent sleep schedule.   ?- Make a list of foods you usually like, which you can usually eat even if you're not especially hungry, for example, chocolate protein drink or homemade smoothie, fruit, yogurt, sunflower seeds (and other nuts or seeds).  Make sure you keep at least some of these foods on hand.   ? ?Goals:  ?1. Follow a consistent sleep schedule, going to bed no later 1 AM, and getting up at 11 AM.   ?2. Follow a consistent eating schedule, aiming for breakfast, lunch, and dinner each day, with at least 1-2 snacks as well.   ?   - Eat something within the first hour of getting up.  This can be something small, but you want to start your day's eating so you don't shrink your "window of eating time."  ?   - For breakfast, include at least a source of protein and some carbohydrate.   ?   - For lunch and dinner, include at least a source of protein, some carbohydrate, and vegetables and/or fruit. ?3. On weekends, set aside a few minutes to plan meals and snacks for the upcoming week.   ? ?Carbohydrate includes starch, sugar, and fiber.  Of these, only sugar and starch raise blood glucose.  (Fiber is found in fruits, vegetables [especially skin, seeds, and stalks], whole grains, and beans.)   ?Starchy (carb) foods: Bread, rice, pasta, potatoes, corn, cereal, oatmeal, crackers, bagels, muffins, all baked goods.  (Fruit and yogurt also have carbohydrate.)  ?Protein foods: Meat, fish, poultry, eggs, dairy foods, and beans such as pinto and kidney beans (beans also provide carbohydrate). If you use beans as your protein source, consider at least 1 full cup to be the minimum portion size. ? ?Follow-up appt on Monday, May 15 at 11 AM.   ?- If need you need to reschedule, call Dr. Gerilyn Pilgrim at [phone] or email [email or web address].   ?    ?

## 2021-11-30 ENCOUNTER — Other Ambulatory Visit: Payer: Self-pay

## 2021-11-30 ENCOUNTER — Emergency Department (HOSPITAL_COMMUNITY)
Admission: EM | Admit: 2021-11-30 | Discharge: 2021-11-30 | Disposition: A | Discharge status: Home / Self Care | Payer: Medicaid Other | Attending: Emergency Medicine | Admitting: Emergency Medicine

## 2021-11-30 DIAGNOSIS — J029 Acute pharyngitis, unspecified: Secondary | ICD-10-CM | POA: Diagnosis present

## 2021-11-30 DIAGNOSIS — Z20822 Contact with and (suspected) exposure to covid-19: Secondary | ICD-10-CM | POA: Insufficient documentation

## 2021-11-30 DIAGNOSIS — N939 Abnormal uterine and vaginal bleeding, unspecified: Secondary | ICD-10-CM | POA: Insufficient documentation

## 2021-11-30 DIAGNOSIS — J020 Streptococcal pharyngitis: Secondary | ICD-10-CM | POA: Insufficient documentation

## 2021-11-30 LAB — WET PREP, GENITAL
Clue Cells Wet Prep HPF POC: NONE SEEN
Sperm: NONE SEEN
Trich, Wet Prep: NONE SEEN
WBC, Wet Prep HPF POC: <10 (ref <10)
Yeast Wet Prep HPF POC: NONE SEEN

## 2021-11-30 LAB — GROUP A STREP BY PCR: Group A Strep by PCR: DETECTED — AB

## 2021-11-30 LAB — I-STAT CHEM 8, ED
BUN: 13 mg/dL (ref 6–20)
Calcium, Ion: 1.14 mmol/L — ABNORMAL LOW (ref 1.15–1.40)
Chloride: 108 mmol/L (ref 98–111)
Creatinine, Ser: 0.4 mg/dL — ABNORMAL LOW (ref 0.44–1.00)
Glucose, Bld: 74 mg/dL (ref 70–99)
HCT: 37 % (ref 36.0–46.0)
Hemoglobin: 12.6 g/dL (ref 12.0–15.0)
Potassium: 3.8 mmol/L (ref 3.5–5.1)
Sodium: 137 mmol/L (ref 135–145)
TCO2: 22 mmol/L (ref 22–32)

## 2021-11-30 LAB — I-STAT BETA HCG BLOOD, ED (MC, WL, AP ONLY): I-stat hCG, quantitative: <5 m[IU]/mL (ref <5)

## 2021-11-30 LAB — RESP PANEL BY RT-PCR (FLU A&B, COVID) ARPGX2
Influenza A by PCR: NEGATIVE
Influenza B by PCR: NEGATIVE
SARS Coronavirus 2 by RT PCR: NEGATIVE

## 2021-11-30 MED ORDER — IBUPROFEN 400 MG PO TABS
600.0000 mg | ORAL_TABLET | Freq: Once | ORAL | Status: AC
  Administered 2021-11-30: 600 mg via ORAL
  Filled 2021-11-30: qty 1

## 2021-11-30 MED ORDER — AMOXICILLIN 500 MG PO CAPS: 500.0000 mg | ORAL_CAPSULE | Freq: Two times a day (BID) | ORAL | 0 refills | Status: AC

## 2021-11-30 NOTE — ED Provider Triage Note (Signed)
Emergency Medicine Provider Triage Evaluation Note ? ?Melanie Maddox , a 21 y.o. female  was evaluated in triage.  Pt complains of generalized body aches, sore throat and cough.  Patient reports that beginning yesterday she started having body aches.  Patient states that she does not have any positive sick contacts.  Patient endorses being vaccinated for flu, COVID.  Patient denies any chest pain, shortness of breath, nausea, vomiting. ? ?Review of Systems  ?Positive:  ?Negative:  ? ?Physical Exam  ?BP 128/88   Pulse 85   Temp 98.9 ?F (37.2 ?C) (Oral)   Resp 14   SpO2 100%  ?Gen:   Awake, no distress   ?Resp:  Normal effort  ?MSK:   Moves extremities without difficulty  ?Other:  1+ swollen tonsils bilaterally.  No exudate. ? ?Medical Decision Making  ?Medically screening exam initiated at 7:09 PM.  Appropriate orders placed.  Melanie Maddox was informed that the remainder of the evaluation will be completed by another provider, this initial triage assessment does not replace that evaluation, and the importance of remaining in the ED until their evaluation is complete. ? ? ?  ?Azucena Cecil, PA-C ?11/30/21 1910 ? ?

## 2021-11-30 NOTE — ED Provider Notes (Signed)
?[facility] ?Provider Note ? ? ?CSN: [id number] ?Arrival date & time: 11/30/21  1851 ? ?  ? ?History ? ?Chief Complaint  ?Patient presents with  ? URI  ? Vaginal Bleeding  ? ? ?Melanie Maddox is a 21 y.o. female who presents to the ED today with complaint of gradual onset, constant, achy, sore throat that began yesterday. Pt also complains of subjective fevers, chills, fatigue, and body aches. She denies any recent sick contacts. She is vaccinated for both COVID and flu.  ? ?She also complains of abnormal uterine bleeding. She reports her last true normal menstrual cycle was 03/02. She states that she started having brown vaginal bleeding last week which stopped for a couple of days. A few days ago she began having vaginal bleeding that seems more consistent with her previous menstrual cycle. She does report she began bleeding on Thursday of last week and it has not stopped. This bleeding is lasting longer than her normal menstrual cycles typically last. She is sexually active with 1 female partner. She reports she was on OCPs in the past however in February switched to depo shot.  ? ?The history is provided by the patient and medical records.  ? ?  ? ?Home Medications ?Prior to Admission medications   ?Medication Sig Start Date End Date Taking? Authorizing Provider  ?amoxicillin (AMOXIL) 500 MG capsule Take 1 capsule (500 mg total) by mouth 2 (two) times daily for 10 days. 11/30/21 12/10/21 Yes Eustaquio Maize, PA-C  ?Clindamycin-Benzoyl Per, Refr, gel Apply 1 application topically 2 (two) times daily. 09/30/20  Yes Meccariello, Bernita Raisin, DO  ?ibuprofen (ADVIL) 400 MG tablet Take 1 tablet (400 mg total) by mouth every 6 (six) hours as needed. 05/13/20  Yes Lamptey, Myrene Galas, MD  ?medroxyPROGESTERone (DEPO-PROVERA) 150 MG/ML injection Inject 150 mg into the muscle every 3 (three) months.   Yes [provider]  ?hydrOXYzine (ATARAX) 10 MG tablet Take 1 tablet (10 mg  total) by mouth 3 (three) times daily as needed. ?Patient not taking: Reported on 11/30/2021 08/30/21   Alen Bleacher, MD  ?norgestimate-ethinyl estradiol ([place] 28) 0.25-35 MG-MCG tablet Take 1 tablet by mouth daily. ?Patient not taking: Reported on 08/18/2021 06/02/21   Gifford Shave, MD  ?   ? ?Allergies    ?Patient has no known allergies.   ? ?Review of Systems   ?Review of Systems  ?Constitutional:  Positive for fatigue and fever (subjective). Negative for chills.  ?HENT:  Positive for sore throat. Negative for congestion, rhinorrhea, trouble swallowing and voice change.   ?Respiratory:  Negative for cough.   ?Gastrointestinal:  Negative for abdominal pain.  ?Genitourinary:  Positive for vaginal bleeding. Negative for pelvic pain.  ?Musculoskeletal:  Positive for myalgias.  ?Neurological:  Negative for headaches.  ?All other systems reviewed and are negative. ? ?Physical Exam ?Updated Vital Signs ?BP 128/88   Pulse 85   Temp 98.9 ?F (37.2 ?C) (Oral)   Resp 14   LMP 11/16/2021   SpO2 100%  ? ?Physical Exam ?Vitals and nursing note reviewed.  ?Constitutional:   ?   Appearance: She is not ill-appearing.  ?HENT:  ?   Head: Normocephalic and atraumatic.  ?   Mouth/Throat:  ?   Pharynx: Posterior oropharyngeal erythema present. No oropharyngeal exudate.  ?   Comments: Phonating normally ?Eyes:  ?   Conjunctiva/sclera: Conjunctivae normal.  ?Cardiovascular:  ?   Rate and Rhythm: Normal rate and regular rhythm.  ?Pulmonary:  ?  Effort: Pulmonary effort is normal.  ?   Breath sounds: Normal breath sounds. No wheezing, rhonchi or rales.  ?Abdominal:  ?   Palpations: Abdomen is soft.  ?   Tenderness: There is no abdominal tenderness.  ?Genitourinary: ?   Comments: Chaperone present for exam. ?No rashes, lesions, or tenderness to external genitalia. No erythema, injury, or tenderness to vaginal mucosa. Mild amount of bright red vaginal blood in vault. No adnexal masses, tenderness, or fullness. No CMT, cervical  friability, or discharge from cervical os. Cervical os is closed. Uterus non-deviated, mobile, nonTTP, and without enlargement.  ?Musculoskeletal:  ?   Cervical back: Neck supple.  ?Skin: ?   General: Skin is warm and dry.  ?Neurological:  ?   Mental Status: She is alert.  ? ? ?ED Results / Procedures / Treatments   ?Labs ?(all labs ordered are listed, but only abnormal results are displayed) ?Labs Reviewed  ?GROUP A STREP BY PCR - Abnormal; Notable for the following components:  ?    Result Value  ? Group A Strep by PCR DETECTED (*)   ? All other components within normal limits  ?I-STAT CHEM 8, ED - Abnormal; Notable for the following components:  ? Creatinine, Ser 0.40 (*)   ? Calcium, Ion 1.14 (*)   ? All other components within normal limits  ?RESP PANEL BY RT-PCR (FLU A&B, COVID) ARPGX2  ?WET PREP, GENITAL  ?I-STAT BETA HCG BLOOD, ED ([facility], [facility], [facility] ONLY)  ?GC/CHLAMYDIA PROBE AMP ([facility]) NOT AT [facility]  ? ? ?EKG ?None ? ?Radiology ?No results found. ? ?Procedures ?Procedures  ? ? ?Medications Ordered in ED ?Medications  ?ibuprofen (ADVIL) tablet 600 mg (600 mg Oral Given 11/30/21 1914)  ? ? ?ED Course/ Medical Decision Making/ A&P ?Clinical Course as of 11/30/21 2201  ?Tue Nov 30, 2021  ?2050 Group A Strep by PCR(!): DETECTED [MV]  ?  ?Clinical Course User Index ?[MV] Eustaquio Maize, PA-C  ? ?                        ?Medical Decision Making ?21 year old female who presents the ED today with complaint of URI-like symptoms for the past day as well as abnormal uterine bleeding for the past 1.5 weeks.  On arrival to the ED today vitals are stable.  Patient was medically screened in triage and work-up started including a strep test and COVID/flu that are currently pending.  When she is brought back to the room she tells me that her last true normal menstrual cycle was 03/02.  She did not have a menstrual cycle at the beginning of April.  Last week she started having some brown vaginal discharge/bleeding which stopped  however several days later began having what appears to be more of a normal menstrual cycle.  This is actually more prolonged than previous cycles however.  Last received Depo shot in February of this year.  She is sexually active with 1 female partner.  We will plan to add on pregnancy test at this time and obtain pelvic exam for further evaluation.  ? ?Pt very hard stick. Nursing staff able to get a very small amount of blood. I stat chem 8 and beta hcg ordered at this time. Remainder of labs discontinued. If pregnancy test returns positive or hgb significantly low will proceed with additional testing/type and screen.  ? ?Beta hcg negative ?I stat chem 8 with stable hgb of 12.6 (12.2 in the past) ? ?Strep  test positive at this time. Pt would like to go home with oral abx.  ? ?Wet prep negative for acute findings. No adnexal or cervical motion TTP to warrant imaging/concern for PID, torsion, TOA. Pt recommended to follow up with gynecology outpatient for abnormal uterine bleeding. Stable for discharge home at this time.  ? ?Problems Addressed: ?Abnormal uterine bleeding: acute illness or injury ?Strep throat: acute illness or injury ? ?Amount and/or Complexity of Data Reviewed ?Labs: ordered. Decision-making details documented in ED Course. ? ? ? ? ? ? ? ? ? ?Final Clinical Impression(s) / ED Diagnoses ?Final diagnoses:  ?Strep throat  ?Abnormal uterine bleeding  ? ? ?Rx / DC Orders ?ED Discharge Orders   ? ?      Ordered  ?  amoxicillin (AMOXIL) 500 MG capsule  2 times daily       ? 11/30/21 2200  ? ?  ?  ? ?  ? ? ?  ?Eustaquio Maize, PA-C ?11/30/21 2202 ? ?  ?Malvin Johns, MD ?11/30/21 2234 ? ?

## 2021-11-30 NOTE — ED Triage Notes (Signed)
Pt here for cold s/s that started last night. Pt endorses body aches, sore throat, fatigue. Pt also endorses vaginal bleeding x2 weeks, states her period had stopped a week ago, then start spotting and is now having bright red vaginal bleeding that started yesterday. Pt denies sick contacts, reports fevers at home, afebrile in triage. ?

## 2021-11-30 NOTE — Discharge Instructions (Addendum)
You have tested positive for strep throat today. Please pick up antibiotics and take as prescribed.  ? ?Continue taking Ibuprofen and Tylenol as needed for pain.  ? ?Follow up with your PCP for further evaluation.  ? ?You should also follow up with your OBGYN in the outpatient setting due to your abnormal uterine bleeding today. If you do not have an OBGYN you can follow up with [facility] for further evaluation.  ? ?Return to the ED for any new/worsening symptoms including worsening sore throat, inability to swallow, drooling on yourself, muffled voice, or any other new/concerning symptoms.  ?

## 2021-12-01 ENCOUNTER — Telehealth: Payer: Self-pay

## 2021-12-01 NOTE — Telephone Encounter (Signed)
Transition Care Management Unsuccessful Follow-up Telephone Call ? ?Date of discharge and from where:  11/30/2021 from [facility] ? ?Attempts:  1st Attempt ? ?Reason for unsuccessful TCM follow-up call:  Left voice message ? ? ? ?

## 2021-12-02 LAB — GC/CHLAMYDIA PROBE AMP (~~LOC~~) NOT AT ARMC
Chlamydia: NEGATIVE
Comment: NEGATIVE
Comment: NORMAL
Neisseria Gonorrhea: NEGATIVE

## 2021-12-02 NOTE — Telephone Encounter (Signed)
Transition Care Management Unsuccessful Follow-up Telephone Call ? ?Date of discharge and from where:  11/30/2021-[facility]  ? ?Attempts:  2nd Attempt ? ?Reason for unsuccessful TCM follow-up call:  Left voice message ? ?  ?

## 2021-12-03 NOTE — Telephone Encounter (Signed)
Transition Care Management Unsuccessful Follow-up Telephone Call ? ?Date of discharge and from where:   11/30/2021-[facility]  ? ?Attempts:  3rd Attempt ? ?Reason for unsuccessful TCM follow-up call:  Unable to reach patient ? ? ? ?

## 2021-12-03 NOTE — Telephone Encounter (Signed)
Transition Care Management Follow-up Telephone Call ?Date of discharge and from where: 11/30/2021 from [facility] ?How have you been since you were released from the hospital? Patient stated that she is feeling okay. She will pick up abx today from pharmacy. We reviewed the result from the ED. Patient does not have any questions or concerns at this time.  ?Any questions or concerns? No ? ?Items Reviewed: ?Did the pt receive and understand the discharge instructions provided? Yes  ?Medications obtained and verified? Yes  ?Other? No  ?Any new allergies since your discharge? No  ?Dietary orders reviewed? No ?Do you have support at home? Yes  ? ?Functional Questionnaire: (I = Independent and D = Dependent) ?ADLs: I ? ?Bathing/Dressing- I ? ?Meal Prep- I ? ?Eating- I ? ?Maintaining continence- I ? ?Transferring/Ambulation- I ? ?Managing Meds- I ? ? ?Follow up appointments reviewed: ? ?PCP Hospital f/u appt confirmed? Yes  Scheduled to see Alease Medina, RD on 12/20/2021 @ 11:00am. ?[facility] f/u appt confirmed? No   ?Are transportation arrangements needed? No  ?If their condition worsens, is the pt aware to call PCP or go to the Emergency Dept.? Yes ?Was the patient provided with contact information for the PCP's office or ED? Yes ?Was to pt encouraged to call back with questions or concerns? Yes ? ?

## 2021-12-07 ENCOUNTER — Ambulatory Visit (INDEPENDENT_AMBULATORY_CARE_PROVIDER_SITE_OTHER): Payer: Medicaid Other

## 2021-12-07 DIAGNOSIS — Z30019 Encounter for initial prescription of contraceptives, unspecified: Secondary | ICD-10-CM

## 2021-12-07 MED ORDER — MEDROXYPROGESTERONE ACETATE 150 MG/ML IM SUSP
150.0000 mg | Freq: Once | INTRAMUSCULAR | Status: AC
  Administered 2021-12-07: 150 mg via INTRAMUSCULAR

## 2021-12-07 NOTE — Progress Notes (Signed)
Patient here today for Depo Provera injection and is within her dates.   ? ?Last contraceptive appt was 09/16/2021. ? ?Depo given in LUOQ today. Site unremarkable & patient tolerated injection.   ? ?Next injection due 02/22/2022-03/08/2022. Reminder card given.   ? ? ? ?

## 2021-12-16 NOTE — Progress Notes (Signed)
Medical Nutrition Therapy ?PCP Jerre Simon, MD ?Appt start time: 1100 end time: 1200 (1 hour) ?Primary concerns today:  Underweight (R63.6) .  ? ?Relevant history/background: Melanie Maddox was referred by Bess Kinds, MD for inability to gain wt.  On 10/22/21: wt was 109.8 lb, ht 67", and BMI 17.2. She has not been able to gain weight despite efforts.  She lives with her boyfriend's family, which includes his mom and 4 siblings, ages 25, 73, 50, and 22 YO.  ? ?Assessment:  Melanie Maddox has managed few changes since last MNT appt.  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  Appetite remains poor, which may be anxiety- and depression-related.  When asked about this, Melanie Maddox admitted that she feels very alone with an uphill battle in front of her.  She said she is receptive to talking with our [facility] psychologist, Dr. Shawnee Knapp.  Seemed less enthusiastic about discussing antidepressant medication with her PCP.   ?I suspect Melanie Maddox's poor sleep, inadequate nutrition, and inadequate energy intake all contribute to her depression, while her depression also contributes to current inability to make the necessary changes in self-care.   ? ?Learning Readiness: Ready; frustrated that "nothing works." ? ?Usual eating pattern: 2 meals and 1-2 snacks per day. ?Weight: 110.8 lb (109.4 lb on 11/22/21; ht is 67") ?Usual physical activity: none currently. ?Sleep: Estimates she gets 6-9 hours per night.  Still can't sleep at all some nights, mostly due to active mind.  Usual bedtime is erratic; ranges from 10 PM to 6 AM.  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  Intends to be up at 11 or 11:30 AM, but seldom achieving this.   ? ?24-hr recall suggests intake of ~2080 kcal:  ?(Up at 1 PM) ?B ( AM)-  --- ?Snk (1 PM)-  10 small cookie/biscuits, water        300  ?L (4 PM)-  1 1/2 c rice, 1 breaded chx  brst, 1 tsp mayo, [employer] 16 oz milkshake 1100 ?Snk ( PM)-  --- ?D (10 PM)-  10 small cookie/biscuits, water        300 ?Snk (1 AM)-  1 breaded chx brst, water         380 ?Typical day? No.  Usually eating more often than yesterday.      2080 ? ?Previous kcal estimates:  ?11/22/21:  1350 ? ?Nutritional Diagnosis: No progress NI-1.4 Inadequate energy intake As related to poor appetite.  As evidenced by usual intake of of only 2 meals and 1-2 small snacks/day. ? ?Handouts given during visit include: ?After-Visit Summary (AVS) ? ?Demonstrated degree of understanding via:  Teach Back  ?Barriers to learning/adherence to lifestyle change: Poor appetite and early satiety.  ? ?Monitoring/Evaluation:  Dietary intake, exercise, and body weight in 5 week(s). ?  ?

## 2021-12-20 ENCOUNTER — Ambulatory Visit (INDEPENDENT_AMBULATORY_CARE_PROVIDER_SITE_OTHER): Payer: Medicaid Other

## 2021-12-20 ENCOUNTER — Ambulatory Visit (INDEPENDENT_AMBULATORY_CARE_PROVIDER_SITE_OTHER): Payer: Medicaid Other | Admitting: Family Medicine

## 2021-12-20 DIAGNOSIS — R636 Underweight: Secondary | ICD-10-CM | POA: Diagnosis not present

## 2021-12-20 DIAGNOSIS — Z111 Encounter for screening for respiratory tuberculosis: Secondary | ICD-10-CM | POA: Diagnosis not present

## 2021-12-20 NOTE — Patient Instructions (Addendum)
When we think about good nutrition, we can consider foods that we call "Everyday foods" and those that are "Sometimes food."  Fruits, vegetables, and protein foods sources are EVERYDAY foods.   ?Carbohydrate includes starch, sugar, and fiber.  Of these, only sugar and starch raise blood glucose.  (Fiber is found in fruits, vegetables [especially skin, seeds, and stalks], whole grains, and beans.)   ?Starchy (carb) foods: Bread, rice, pasta, potatoes, corn, cereal, oatmeal, crackers, bagels, muffins, all baked goods.  (Fruit and yogurt also have carbohydrate.)  ?Protein foods: Meat, fish, poultry, eggs, dairy foods, and beans such as pinto and kidney beans (beans also provide carbohydrate). If you use beans as your protein source, consider at least 1 full cup to be the minimum portion size.   ? ?- Make 2 lists:  ?1. Foods you can use for snacks. ?2. Meal ideas, such as rice, beans, vegetables.  (Consider using frozen vegetables to make it easy.)   ? ?- Expect a phone call from our psychologist, Dr. Shawnee Knapp to schedule an appointment.    ? ?- Call TODAY to see if you are eligible for food stamps.   ? ?Goals:  ?1. Move bedtime earlier, aiming for no later than 2 AM. (eventually moving bedtime to no later than 1 AM).  Maintain consistency in getting up at 11 AM.   ? ?2. Eat at least 4 times per day, including something for breakfast no more than 1 hour after getting up.   ? ?3. On weekends, set aside a few minutes to plan foods for the upcoming week, based on foods available at home.  Include eating times you are targeting for at least the first 3 days of the week.  Set alarms on your phone to remind you to eat at the times you are intending.  For example:  ?- Breakfast by 12:30 PM ?- Lunch by 3:30 PM ?- Dinner by 7:30 PM ?- Snack by 9 PM (include snacks at any time of day) ? ?Follow-up nutrition appt on Tuesday, June 20 at 11 AM.  ?  ?  ?

## 2021-12-20 NOTE — Progress Notes (Signed)
Patient presents in nurse clinic for PPD placement. ? ?PPD placed in left forearm without complication.  ? ?Patient to return on 5/17 at noon for PPD read.  ? ? ?

## 2021-12-21 ENCOUNTER — Telehealth: Payer: Self-pay | Admitting: Psychology

## 2021-12-21 NOTE — Telephone Encounter (Signed)
Called pt and scheduled for June 10 at 10AM ? ? ?Royetta Asal, PhD., LMFT ? ?

## 2021-12-22 ENCOUNTER — Ambulatory Visit: Payer: Medicaid Other

## 2021-12-22 DIAGNOSIS — Z111 Encounter for screening for respiratory tuberculosis: Secondary | ICD-10-CM

## 2021-12-22 LAB — TB SKIN TEST
Induration: 0 mm
TB Skin Test: NEGATIVE

## 2021-12-22 NOTE — Progress Notes (Signed)
PPD Reading Note  PPD read and results entered in [employer].  Result: 0 mm induration.  Interpretation: Negative  Allergic reaction: no

## 2022-01-11 ENCOUNTER — Encounter: Payer: Self-pay | Admitting: *Deleted

## 2022-01-15 ENCOUNTER — Emergency Department (HOSPITAL_COMMUNITY)
Admission: EM | Admit: 2022-01-15 | Discharge: 2022-01-15 | Disposition: A | Discharge status: Home / Self Care | Payer: Medicaid Other | Attending: Emergency Medicine | Admitting: Emergency Medicine

## 2022-01-15 ENCOUNTER — Emergency Department (HOSPITAL_COMMUNITY): Payer: Medicaid Other

## 2022-01-15 ENCOUNTER — Encounter (HOSPITAL_COMMUNITY): Payer: Self-pay | Admitting: Emergency Medicine

## 2022-01-15 ENCOUNTER — Other Ambulatory Visit: Payer: Self-pay

## 2022-01-15 DIAGNOSIS — R002 Palpitations: Secondary | ICD-10-CM | POA: Diagnosis not present

## 2022-01-15 DIAGNOSIS — R0602 Shortness of breath: Secondary | ICD-10-CM | POA: Diagnosis not present

## 2022-01-15 DIAGNOSIS — R079 Chest pain, unspecified: Secondary | ICD-10-CM | POA: Diagnosis not present

## 2022-01-15 DIAGNOSIS — R0789 Other chest pain: Secondary | ICD-10-CM | POA: Diagnosis not present

## 2022-01-15 LAB — CBC
HCT: 34 % — ABNORMAL LOW (ref 36.0–46.0)
Hemoglobin: 10 g/dL — ABNORMAL LOW (ref 12.0–15.0)
MCH: 23.5 pg — ABNORMAL LOW (ref 26.0–34.0)
MCHC: 29.4 g/dL — ABNORMAL LOW (ref 30.0–36.0)
MCV: 80 fL (ref 80.0–100.0)
Platelets: 242 10*3/uL (ref 150–400)
RBC: 4.25 MIL/uL (ref 3.87–5.11)
RDW: 15.6 % — ABNORMAL HIGH (ref 11.5–15.5)
WBC: 5.8 10*3/uL (ref 4.0–10.5)
nRBC: 0 % (ref 0.0–0.2)

## 2022-01-15 LAB — BASIC METABOLIC PANEL
Anion gap: 10 (ref 5–15)
BUN: 15 mg/dL (ref 6–20)
CO2: 20 mmol/L — ABNORMAL LOW (ref 22–32)
Calcium: 9.5 mg/dL (ref 8.9–10.3)
Chloride: 109 mmol/L (ref 98–111)
Creatinine, Ser: 0.53 mg/dL (ref 0.44–1.00)
GFR, Estimated: >60 mL/min (ref >60)
Glucose, Bld: 87 mg/dL (ref 70–99)
Potassium: 3.6 mmol/L (ref 3.5–5.1)
Sodium: 139 mmol/L (ref 135–145)

## 2022-01-15 LAB — TROPONIN I (HIGH SENSITIVITY)
Troponin I (High Sensitivity): 3 ng/L (ref <18)
Troponin I (High Sensitivity): 3 ng/L (ref <18)

## 2022-01-15 LAB — I-STAT BETA HCG BLOOD, ED (MC, WL, AP ONLY): I-stat hCG, quantitative: <5 m[IU]/mL (ref <5)

## 2022-01-15 MED ORDER — HYDROXYZINE HCL 25 MG PO TABS: 25.0000 mg | ORAL_TABLET | Freq: Four times a day (QID) | ORAL | 0 refills | Status: AC | PRN

## 2022-01-15 NOTE — ED Provider Notes (Signed)
[facility] EMERGENCY DEPARTMENT Provider Note   CSN: [id number] Arrival date & time: 01/15/22  0532     History  Chief Complaint  Patient presents with   Shortness of Breath   Chest Pain    Melanie Maddox is a 21 y.o. female.   Shortness of Breath Associated symptoms: chest pain   Associated symptoms: no abdominal pain, no cough, no ear pain, no fever, no rash, no sore throat and no vomiting   Chest Pain Associated symptoms: palpitations and shortness of breath   Associated symptoms: no abdominal pain, no back pain, no cough, no fever and no vomiting    Patient states symptoms began around 4 AM this morning.  She states that she was overly anxious/emotional/angry during enunciated situation when she began to feel chest pain, shortness of breath and palpitations.  She has a history of the symptoms happening prior twice before when she has been angry/frustrated.  She states that usually last 30 minutes to an hour.  She typically can walk outside and get fresh air and "calm herself down," and her symptoms resolved.  Symptoms are not associated with physical activity.  Currently, she is endorsing no symptoms.  She was prescribed hydroxyzine in the past for anxiety related to sleep, but has not taken the medicine since it was prescribed. Denies fever, chills, night sweats, abdominal pain, N/V/D, urinary/vaginal symptoms, change in bowel habits.  Past Medical History:  Diagnosis Date   Anemia    History reviewed. No pertinent surgical history.  Home Medications Prior to Admission medications   Medication Sig Start Date End Date Taking? Authorizing Provider  hydrOXYzine (ATARAX) 25 MG tablet Take 1 tablet (25 mg total) by mouth every 6 (six) hours as needed for anxiety. 01/15/22  Yes Sherian Maroon A, PA  Clindamycin-Benzoyl Per, Refr, gel Apply 1 application topically 2 (two) times daily. 09/30/20   Meccariello, Solmon Ice, DO  ibuprofen (ADVIL) 400 MG tablet  Take 1 tablet (400 mg total) by mouth every 6 (six) hours as needed. 05/13/20   Merrilee Jansky, MD  medroxyPROGESTERone (DEPO-PROVERA) 150 MG/ML injection Inject 150 mg into the muscle every 3 (three) months.    [provider]  norgestimate-ethinyl estradiol (SPRINTEC 28) 0.25-35 MG-MCG tablet Take 1 tablet by mouth daily. Patient not taking: Reported on 08/18/2021 06/02/21   Derrel Nip, MD      Allergies    Patient has no known allergies.    Review of Systems   Review of Systems  Constitutional:  Negative for chills and fever.  HENT:  Negative for ear pain and sore throat.   Eyes:  Negative for pain and visual disturbance.  Respiratory:  Positive for shortness of breath. Negative for cough.   Cardiovascular:  Positive for chest pain and palpitations. Negative for leg swelling.  Gastrointestinal:  Negative for abdominal pain and vomiting.  Genitourinary:  Negative for dysuria and hematuria.  Musculoskeletal:  Negative for arthralgias and back pain.  Skin:  Negative for color change and rash.  Neurological:  Negative for seizures and syncope.  All other systems reviewed and are negative.   Physical Exam Updated Vital Signs BP 112/88 (BP Location: Left Arm)   Pulse 76   Temp 98.1 F (36.7 C) (Oral)   Resp 18   SpO2 100%  Physical Exam Vitals and nursing note reviewed.  Constitutional:      General: She is not in acute distress.    Appearance: She is well-developed and normal weight.  She is not ill-appearing, toxic-appearing or diaphoretic.  HENT:     Head: Normocephalic and atraumatic.     Mouth/Throat:     Mouth: Mucous membranes are moist.     Pharynx: Oropharynx is clear.  Eyes:     Extraocular Movements: Extraocular movements intact.     Conjunctiva/sclera: Conjunctivae normal.  Cardiovascular:     Rate and Rhythm: Normal rate and regular rhythm.     Heart sounds: No murmur heard. Pulmonary:     Effort: Pulmonary effort is normal. No respiratory  distress.     Breath sounds: Normal breath sounds.  Chest:     Chest wall: No tenderness.  Abdominal:     General: Bowel sounds are normal.     Palpations: Abdomen is soft.     Tenderness: There is no abdominal tenderness. There is no guarding.  Musculoskeletal:        General: No swelling. Normal range of motion.     Cervical back: Normal range of motion and neck supple.     Right lower leg: No edema.     Left lower leg: No edema.  Skin:    General: Skin is warm and dry.     Capillary Refill: Capillary refill takes less than 2 seconds.  Neurological:     General: No focal deficit present.     Mental Status: She is alert and oriented to person, place, and time.  Psychiatric:        Mood and Affect: Mood normal.    ED Results / Procedures / Treatments   Labs (all labs ordered are listed, but only abnormal results are displayed) Labs Reviewed  BASIC METABOLIC PANEL - Abnormal; Notable for the following components:      Result Value   CO2 20 (*)    All other components within normal limits  CBC - Abnormal; Notable for the following components:   Hemoglobin 10.0 (*)    HCT 34.0 (*)    MCH 23.5 (*)    MCHC 29.4 (*)    RDW 15.6 (*)    All other components within normal limits  I-STAT BETA HCG BLOOD, ED ([facility], [facility], [facility] ONLY)  TROPONIN I (HIGH SENSITIVITY)  TROPONIN I (HIGH SENSITIVITY)    EKG None  Radiology DG Chest 2 View  Result Date: 01/15/2022 CLINICAL DATA:  Chest pain EXAM: CHEST - 2 VIEW COMPARISON:  None Available. FINDINGS: Normal heart size and mediastinal contours. No acute infiltrate or edema. No effusion or pneumothorax. No acute osseous findings. IMPRESSION: Negative chest. Electronically Signed   By: Tiburcio PeaJonathan  Watts M.D.   On: 01/15/2022 07:25    Procedures Procedures    Medications Ordered in ED Medications - No data to display  ED Course/ Medical Decision Making/ A&P                           Medical Decision Making Amount and/or Complexity of Data  Reviewed Labs: ordered. Radiology: ordered.  Risk Prescription drug management.   This patient presents to the ED for concern of chest pain, this involves an extensive number of treatment options, and is a complaint that carries with it a high risk of complications and morbidity.  The differential diagnosis includes The emergent differential diagnosis of chest pain includes: Acute coronary syndrome, pericarditis, aortic dissection, pulmonary embolism, tension pneumothorax, and esophageal rupture. I do not believe the patient has an emergent cause of chest pain, other urgent/non-acute considerations include, but are not  limited to: chronic angina, aortic stenosis, cardiomyopathy, myocarditis, mitral valve prolapse, pulmonary hypertension, hypertrophic obstructive cardiomyopathy (HOCM), aortic insufficiency, right ventricular hypertrophy, pneumonia, pleuritis, bronchitis, pneumothorax, tumor, gastroesophageal reflux disease (GERD), esophageal spasm, Mallory-Weiss syndrome, peptic ulcer disease, biliary disease, pancreatitis, functional gastrointestinal pain, cervical or thoracic disk disease or arthritis, shoulder arthritis, costochondritis, subacromial bursitis, anxiety or panic attack, herpes zoster, breast disorders, chest wall tumors, thoracic outlet syndrome, mediastinitis.   Co morbidities that complicate the patient evaluation  History of anxiety   Additional history obtained:  Additional history obtained from mother who is at bedside   Lab Tests:  I Ordered, and personally interpreted labs.  The pertinent results include: Hemoglobin of 10.0 which is decreased and patient baseline.   Imaging Studies ordered:  I ordered imaging studies including chest x-ray I independently visualized and interpreted imaging which showed no acute cardiopulmonary disease I agree with the radiologist interpretation   Cardiac Monitoring: / EKG:  The patient was maintained on a cardiac monitor.  I  personally viewed and interpreted the cardiac monitored which showed an underlying rhythm of: Sinus rhythm   Consultations Obtained:  N/a   Problem List / ED Course / Critical interventions / Medication management  Chest pain Reevaluation of the patient showed that the patient improved I have reviewed the patients home medicines and have made adjustments as needed   Social Determinants of Health:  Denies cocaine, alcohol, illicit drug use.   Test / Admission - Considered:  Chest pain/SOB Vitals signs within normal range and stable throughout visit. Laboratory/imaging studies significant for: No acute abnormalities Heart score: 0. Wells score 0 but patient not able to pass PERC criteria due to OCP use. Doubt PE  Patient's symptoms most likely related to anxiety/panic attacks given patient's symptoms related to emotional stressors as well as no change from prior episodes.  Patient has been prescribed hydroxyzine in the past before, but did not take it from her PCP.  Recommend restarting hydroxyzine therapy to take as needed for anxiety/panic attacks.  Close follow-up up with PCP recommended for further adjustment of care.  Patient also made aware of current mild anemia noticed on laboratory studies drawn today.  Recommended follow-up with PCP regarding this as well. Worrisome signs and symptoms were discussed with the patient, and the patient acknowledged understanding to return to the ED if noticed. Patient was stable upon discharge.          Final Clinical Impression(s) / ED Diagnoses Final diagnoses:  Chest pain, unspecified type    Rx / DC Orders ED Discharge Orders          Ordered    hydrOXYzine (ATARAX) 25 MG tablet  Every 6 hours PRN        01/15/22 0902              Peter Garter, PA 01/15/22 1216    Milagros Loll, MD 01/16/22 [phone]

## 2022-01-15 NOTE — Discharge Instructions (Addendum)
Your symptoms are most likely related to anxiety.  I will prescribe hydroxyzine to take every 6 hours as needed for panic/anxiety symptoms.  You should follow-up with your PCP regarding future treatment/adjustment of medical care.  We also noticed that you were slightly anemic today on your laboratory studies.  Recommend iron rich food as provided in your discharge papers as well as close follow-up with PCP regarding this finding.  Please do not hesitate to return to the emergency department if the worrisome signs and symptoms we discussed become apparent.

## 2022-01-15 NOTE — ED Triage Notes (Signed)
Pt reported to ED with c/o shortness of breath and chest pain. States that symptoms are aggravated by emotional distress. Pt denies any shortness of breath or chest pain at this time. States she has a hx of anxiety.

## 2022-01-17 ENCOUNTER — Telehealth: Payer: Self-pay

## 2022-01-17 NOTE — Telephone Encounter (Signed)
Transition Care Management Unsuccessful Follow-up Telephone Call  Date of discharge and from where:  01/15/2022[facility]  Attempts:  1st Attempt  Reason for unsuccessful TCM follow-up call:  Left voice message

## 2022-01-18 NOTE — Telephone Encounter (Signed)
Transition Care Management Unsuccessful Follow-up Telephone Call  Date of discharge and from where:  01/15/2022-[facility]   Attempts:  2nd Attempt  Reason for unsuccessful TCM follow-up call:  Left voice message    

## 2022-01-19 NOTE — Telephone Encounter (Signed)
Transition Care Management Unsuccessful Follow-up Telephone Call  Date of discharge and from where:  01/15/2022 from [facility]  Attempts:  3rd Attempt  Reason for unsuccessful TCM follow-up call:  Unable to reach patient

## 2022-01-25 ENCOUNTER — Ambulatory Visit: Payer: Medicaid Other | Admitting: Psychology

## 2022-01-25 ENCOUNTER — Ambulatory Visit: Payer: Medicaid Other | Admitting: Family Medicine

## 2022-01-25 NOTE — Progress Notes (Deleted)
Medical Nutrition Therapy PCP Alen Bleacher, MD Appt start time: 1100 end time: 1200 (1 hour) Primary concerns today:  Underweight (R63.6) .   Relevant history/background: Ms. Basic was referred by Holley Bouche, MD for inability to gain wt.  On 10/22/21: wt was 109.8 lb, ht 67", and BMI 17.2. She has not been able to gain weight despite efforts.  She lives with her boyfriend's family, which includes his mom and 4 siblings, ages 65, 25, 27, and 22 YO.   Assessment:  Kimber presented to the ER on 01/15/22 with SOB and chest pain, thought to be a panic attack by the attending physician.  She she met this morning with [facility] psychologist Dr. Hartford Poli ***.  Learning Readiness: Ready; frustrated that "nothing works."  Usual eating pattern: 2 meals and 1-2 snacks per day. Weight: *** lb (110.8 lb on 12/20/21; ht is 67") Usual physical activity: none currently. Sleep: Estimates she gets ***6-9 hours per night.    24-hr recall suggests intake of *** kcal:  (Up at  AM) B ( AM)-   Snk ( AM)-   L ( PM)-   Snk ( PM)-   D ( PM)-   Snk ( PM)-   Typical day? {yes no:[id number]}  Previous kcal estimates:  12/20/21:  2080 11/22/21:  1350  Nutritional Diagnosis: *** progress NI-1.4 Inadequate energy intake As related to poor appetite.  As evidenced by usual intake of of only 2 meals and 1-2 small snacks/day.  Handouts given during visit include: After-Visit Summary (AVS)  Demonstrated degree of understanding via:  Teach Back  Barriers to learning/adherence to lifestyle change: Poor appetite and early satiety.   Monitoring/Evaluation:  Dietary intake, exercise, and body weight in 5 week(s).  From 5/15 *** - Make 2 lists:  1. Foods you can use for snacks. 2. Meal ideas, such as rice, beans, vegetables.  (Consider using frozen vegetables to make it easy.)     - Call TODAY to see if you are eligible for food stamps.    Goals:  1. Move bedtime earlier, aiming for no later than 2 AM. (eventually moving bedtime  to no later than 1 AM).  Maintain consistency in getting up at 11 AM.   2. Eat at least 4 times per day, including something for breakfast no more than 1 hour after getting up.   3. On weekends, set aside a few minutes to plan foods for the upcoming week, based on foods available at home.  Include eating times you are targeting for at least the first 3 days of the week.  Set alarms on your phone to remind you to eat at the times you are intending.  For example:  - Breakfast by 12:30 PM - Lunch by 3:30 PM - Dinner by 7:30 PM - Snack by 9 PM (include snacks at any time of day)   Follow-up nutrition appt on ***.

## 2022-01-26 ENCOUNTER — Ambulatory Visit: Payer: Medicaid Other | Admitting: Family Medicine

## 2022-02-25 ENCOUNTER — Ambulatory Visit (INDEPENDENT_AMBULATORY_CARE_PROVIDER_SITE_OTHER): Payer: Medicaid Other

## 2022-02-25 DIAGNOSIS — Z30019 Encounter for initial prescription of contraceptives, unspecified: Secondary | ICD-10-CM

## 2022-02-25 MED ORDER — MEDROXYPROGESTERONE ACETATE 150 MG/ML IM SUSP
150.0000 mg | Freq: Once | INTRAMUSCULAR | Status: AC
  Administered 2022-02-25: 150 mg via INTRAMUSCULAR

## 2022-02-25 NOTE — Progress Notes (Signed)
Patient here today for Depo Provera injection and is within her dates.    Last contraceptive appt was 09/16/2021  Depo given in [place] today.  Site unremarkable & patient tolerated injection.    Next injection due 05/13/22-05/27/2022.  Reminder card given.    Veronda Prude, RN

## 2022-04-01 ENCOUNTER — Other Ambulatory Visit: Payer: Self-pay | Admitting: *Deleted

## 2022-04-01 DIAGNOSIS — L700 Acne vulgaris: Secondary | ICD-10-CM

## 2022-04-02 MED ORDER — CLINDAMYCIN PHOS-BENZOYL PEROX 1.2-5 % EX GEL: 1.0000 "application " | Freq: Two times a day (BID) | CUTANEOUS | 2 refills | Status: DC

## 2022-05-19 ENCOUNTER — Ambulatory Visit: Payer: Medicaid Other

## 2022-05-30 ENCOUNTER — Ambulatory Visit (INDEPENDENT_AMBULATORY_CARE_PROVIDER_SITE_OTHER): Payer: Medicaid Other

## 2022-05-30 DIAGNOSIS — Z30019 Encounter for initial prescription of contraceptives, unspecified: Secondary | ICD-10-CM

## 2022-05-30 LAB — POCT URINE PREGNANCY: Preg Test, Ur: NEGATIVE

## 2022-05-30 MED ORDER — MEDROXYPROGESTERONE ACETATE 150 MG/ML IM SUSP
150.0000 mg | Freq: Once | INTRAMUSCULAR | Status: AC
  Administered 2022-05-30: 150 mg via INTRAMUSCULAR

## 2022-05-30 NOTE — Progress Notes (Signed)
Patient here today for Depo Provera injection and is not within her dates.  Urine pregnancy negative. Reports sexual activity within the last two weeks. Unsure of last date. She will follow up in two weeks for repeat pregnancy test. Patient elected to proceed with depo injection today.   Last contraceptive appt was 09/16/21  Depo given in [place] today.  Site unremarkable & patient tolerated injection.    Next injection due 08/15/22-08/29/22.  Reminder card given.    Talbot Grumbling, RN

## 2022-06-10 IMAGING — CR DG CHEST 2V
2 series · 2 of 2 positions shown · non-contrast
Comparison: None Available.

CLINICAL DATA: Chest pain

EXAM:
CHEST - 2 VIEW

[chest lat]
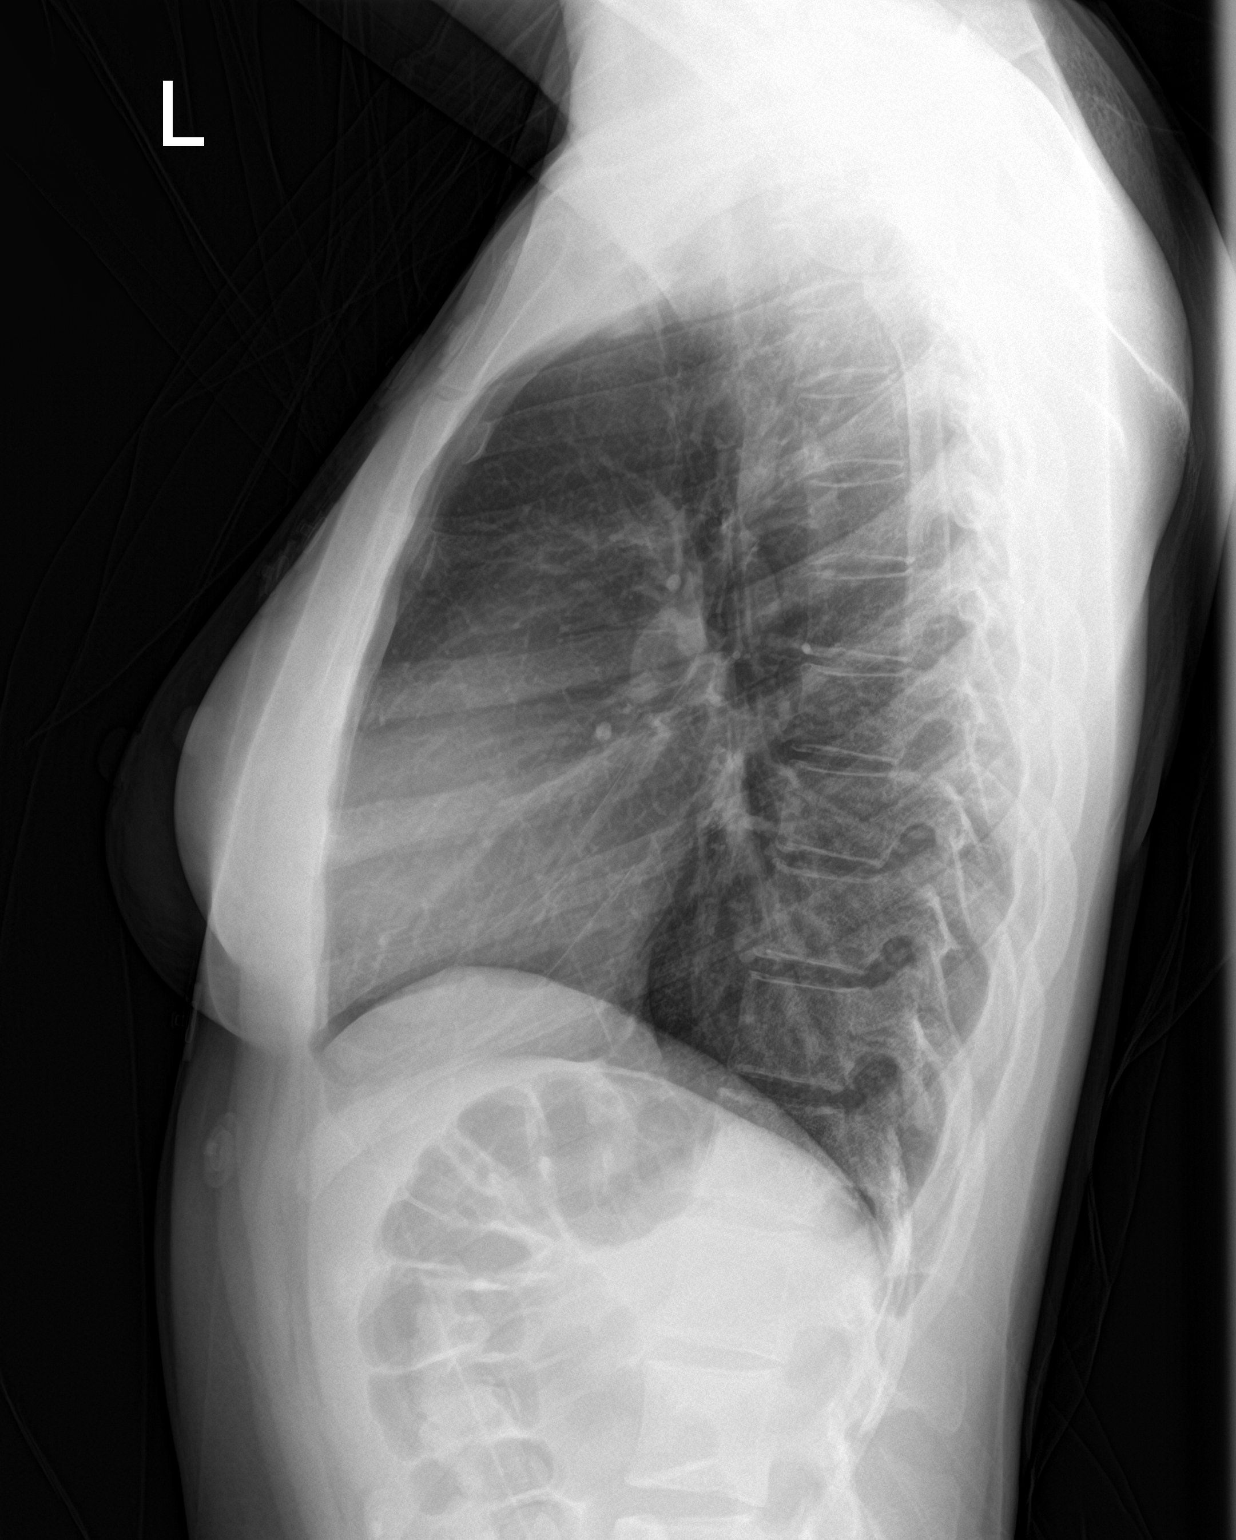

[chest pa]
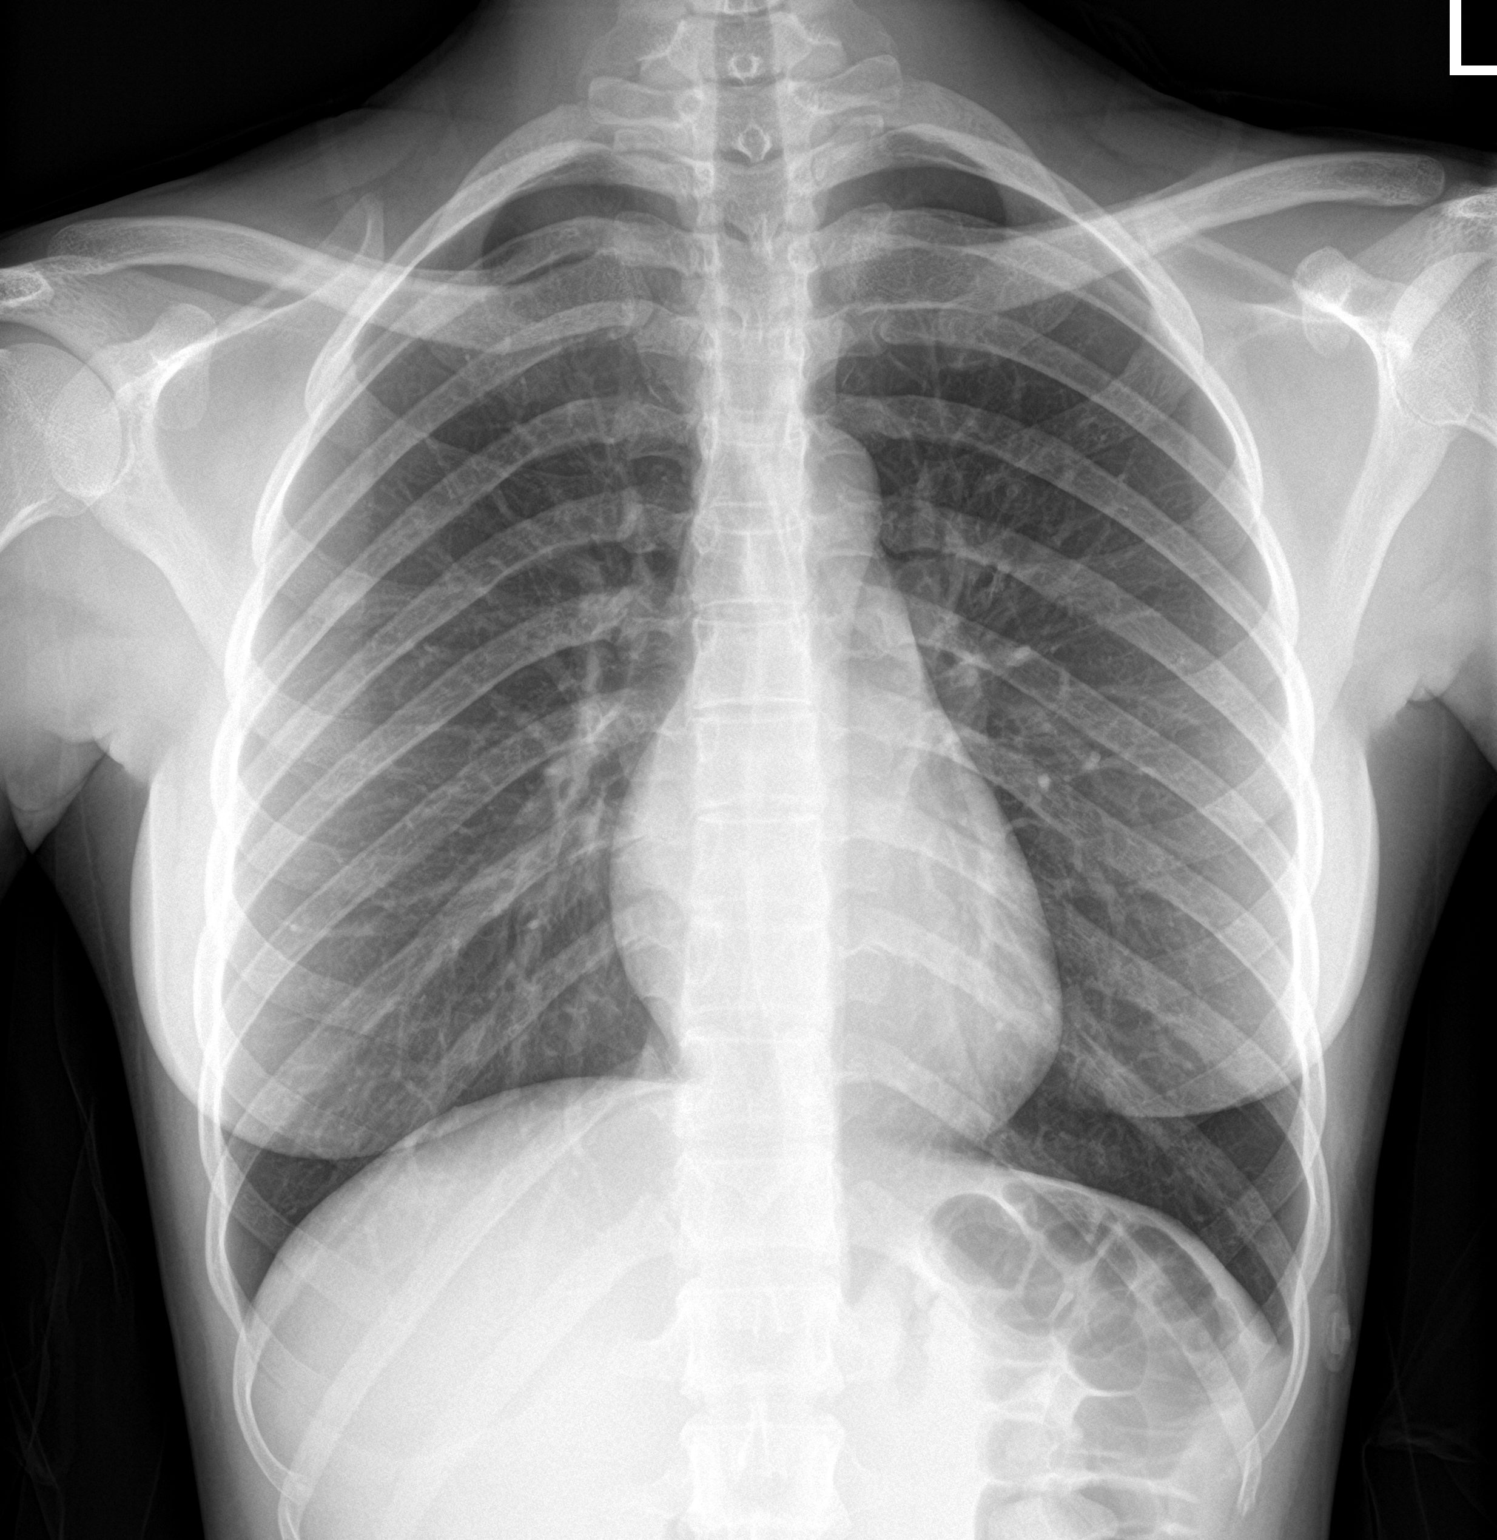

[2 of 2 positions shown; findings below may reference images not displayed]

FINDINGS: Normal heart size and mediastinal contours. No acute infiltrate or
edema. No effusion or pneumothorax. No acute osseous findings.
IMPRESSION: Negative chest.

## 2022-08-04 ENCOUNTER — Ambulatory Visit: Payer: Medicaid Other | Admitting: Student

## 2022-08-05 ENCOUNTER — Encounter: Payer: Self-pay | Admitting: Student

## 2022-08-05 ENCOUNTER — Ambulatory Visit: Payer: Medicaid Other | Admitting: Student

## 2022-08-05 VITALS — BP 119/76 | HR 75 | Ht 67.0 in | Wt 108.2 lb

## 2022-08-05 DIAGNOSIS — K644 Residual hemorrhoidal skin tags: Secondary | ICD-10-CM

## 2022-08-05 MED ORDER — PREPARATION H 0.25-88.44 % RE SUPP: 1.0000 | RECTAL | 0 refills | Status: AC | PRN

## 2022-08-05 MED ORDER — HYDROCORTISONE (PERIANAL) 2.5 % EX CREA: 1.0000 | TOPICAL_CREAM | Freq: Two times a day (BID) | CUTANEOUS | 0 refills | Status: AC

## 2022-08-05 NOTE — Progress Notes (Signed)
    SUBJECTIVE:   CHIEF COMPLAINT / HPI:   Hemorrhoid Patient says since Wednesday she has been having pain and swelling around her anus.  She does feel like she may have an external hemorrhoid.  She has never had a hemorrhoid before.  She denies any trauma to this area.  Denies any rectal bleeding or bloody stools.  She has tried putting oil on it yesterday but that did not help.  She feels like it is mostly itchy but has pain when she is having a bowel movement.  She is unsure when her last bowel movement is as she has been trying to retain due to pain.  PERTINENT  PMH / PSH:   OBJECTIVE:   BP 119/76   Pulse 75   Ht 5\' 7"  (1.702 m)   Wt 108 lb 3.2 oz (49.1 kg)   SpO2 100%   BMI 16.95 kg/m   General: Well appearing, NAD, awake, alert, responsive to questions Head: Normocephalic atraumatic Respiratory: chest rises symmetrically,  no increased work of breathing Rectal: Patient has external hemorrhoid on examination, does not appear thrombosed, digital rectal examination without any internal masses, no anal fissure seen on examination  ASSESSMENT/PLAN:   External hemorrhoid Patient has external hemorrhoid on examination. Discussed symptomatic measures to help including sitz bath and fiber supplementation with increasing water intake - shark liver oil-cocoa butter (PREPARATION H) 0.25-88.44 % suppository; Place 1 suppository rectally as needed for hemorrhoids.  Dispense: 12 suppository; Refill: 0 - hydrocortisone (ANUSOL-HC) 2.5 % rectal cream; Place 1 Application rectally 2 (two) times daily.  Dispense: 30 g; Refill: 0    02-25-1986, MD [facility]

## 2022-08-05 NOTE — Patient Instructions (Addendum)
Please take fiber supplements and lots of water to help with bowel movement We will prescribe preparation H for you and anusol cream to help Please do sitz baths  Hemorrhoids Hemorrhoids are swollen veins that may develop: In the butt (rectum). These are called internal hemorrhoids. Around the opening of the butt (anus). These are called external hemorrhoids. Hemorrhoids can cause pain, itching, or bleeding. Most of the time, they do not cause serious problems. They usually get better with diet changes, lifestyle changes, and other home treatments. What are the causes? This condition may be caused by: Having trouble pooping (constipation). Pushing hard (straining) to poop. Watery poop (diarrhea). Pregnancy. Being very overweight (obese). Sitting for long periods of time. Heavy lifting or other activity that causes you to strain. Anal sex. Riding a bike for a long period of time. What are the signs or symptoms? Symptoms of this condition include: Pain. Itching or soreness in the butt. Bleeding from the butt. Leaking poop. Swelling in the area. One or more lumps around the opening of your butt. How is this diagnosed? A doctor can often diagnose this condition by looking at the affected area. The doctor may also: Do an exam that involves feeling the area with a gloved hand (digital rectal exam). Examine the area inside your butt using a small tube (anoscope). Order blood tests. This may be done if you have lost a lot of blood. Have you get a test that involves looking inside the colon using a flexible tube with a camera on the end (sigmoidoscopy or colonoscopy). Follow these instructions at home: Eating and drinking  Eat foods that have a lot of fiber in them. These include whole grains, beans, nuts, fruits, and vegetables. Ask your doctor about taking products that have added fiber (fibersupplements). Reduce the amount of fat in your diet. You can do this by: Eating low-fat  dairy products. Eating less red meat. Avoiding processed foods. Drink enough fluid to keep your pee (urine) pale yellow. Managing pain and swelling  Take a warm-water bath (sitz bath) for 20 minutes to ease pain. Do this 3-4 times a day. You may do this in a bathtub or using a portable sitz bath that fits over the toilet. If told, put ice on the painful area. It may be helpful to use ice between your warm baths. Put ice in a plastic bag. Place a towel between your skin and the bag. Leave the ice on for 20 minutes, 2-3 times a day. General instructions Take over-the-counter and prescription medicines only as told by your doctor. Medicated creams and medicines may be used as told. Exercise often. Ask your doctor how much and what kind of exercise is best for you. Go to the bathroom when you have the urge to poop. Do not wait. Avoid pushing too hard when you poop. Keep your butt dry and clean. Use wet toilet paper or moist towelettes after pooping. Do not sit on the toilet for a long time. Keep all follow-up visits as told by your doctor. This is important. Contact a doctor if you: Have pain and swelling that do not get better with treatment or medicine. Have trouble pooping. Cannot poop. Have pain or swelling outside the area of the hemorrhoids. Get help right away if you have: Bleeding that will not stop. Summary Hemorrhoids are swollen veins in the butt or around the opening of the butt. They can cause pain, itching, or bleeding. Eat foods that have a lot of fiber in them.  These include whole grains, beans, nuts, fruits, and vegetables. Take a warm-water bath (sitz bath) for 20 minutes to ease pain. Do this 3-4 times a day. This information is not intended to replace advice given to you by your health care provider. Make sure you discuss any questions you have with your health care provider.

## 2022-08-05 NOTE — Assessment & Plan Note (Signed)
Patient has external hemorrhoid on examination. Discussed symptomatic measures to help including sitz bath and fiber supplementation with increasing water intake - shark liver oil-cocoa butter (PREPARATION H) 0.25-88.44 % suppository; Place 1 suppository rectally as needed for hemorrhoids.  Dispense: 12 suppository; Refill: 0 - hydrocortisone (ANUSOL-HC) 2.5 % rectal cream; Place 1 Application rectally 2 (two) times daily.  Dispense: 30 g; Refill: 0

## 2022-08-15 ENCOUNTER — Ambulatory Visit (INDEPENDENT_AMBULATORY_CARE_PROVIDER_SITE_OTHER): Payer: Medicaid Other

## 2022-08-15 DIAGNOSIS — Z3042 Encounter for surveillance of injectable contraceptive: Secondary | ICD-10-CM

## 2022-08-15 MED ORDER — MEDROXYPROGESTERONE ACETATE 150 MG/ML IM SUSP
150.0000 mg | Freq: Once | INTRAMUSCULAR | Status: AC
  Administered 2022-08-15: 150 mg via INTRAMUSCULAR

## 2022-08-15 NOTE — Progress Notes (Addendum)
Patient here today for Depo Provera injection and is within her dates.    Last contraceptive appt was 09/16/21. Advised patient that she is due for yearly contraceptive appointment and that she would need to schedule with PCP prior to receiving next depo injection. Patient verbalizes understanding.   Depo given in [place] today.  Site unremarkable & patient tolerated injection.    Next injection due 11/01/22-11/15/22.  Reminder card given.    Talbot Grumbling, RN

## 2022-08-26 NOTE — Progress Notes (Signed)
I was preceptor for this office visit.  

## 2022-08-30 ENCOUNTER — Encounter: Payer: Self-pay | Admitting: Student

## 2022-09-08 ENCOUNTER — Emergency Department (HOSPITAL_COMMUNITY): Payer: Medicaid Other

## 2022-09-08 ENCOUNTER — Other Ambulatory Visit: Payer: Self-pay

## 2022-09-08 ENCOUNTER — Encounter (HOSPITAL_COMMUNITY): Payer: Self-pay

## 2022-09-08 ENCOUNTER — Emergency Department (HOSPITAL_COMMUNITY)
Admission: EM | Admit: 2022-09-08 | Discharge: 2022-09-09 | Disposition: A | Discharge status: Home / Self Care | Payer: Medicaid Other | Attending: Emergency Medicine | Admitting: Emergency Medicine

## 2022-09-08 DIAGNOSIS — S0990XA Unspecified injury of head, initial encounter: Secondary | ICD-10-CM

## 2022-09-08 DIAGNOSIS — Y92410 Unspecified street and highway as the place of occurrence of the external cause: Secondary | ICD-10-CM | POA: Insufficient documentation

## 2022-09-08 DIAGNOSIS — G4489 Other headache syndrome: Secondary | ICD-10-CM | POA: Diagnosis not present

## 2022-09-08 DIAGNOSIS — R519 Headache, unspecified: Secondary | ICD-10-CM | POA: Insufficient documentation
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
## 2022-09-09 NOTE — Discharge Instructions (Signed)
You were evaluated in the Emergency Department and after careful evaluation, we did not find any emergent condition requiring admission or further testing in the hospital.  Your exam/testing today was overall reassuring.  If you wake up in the morning and are still experiencing headaches or lightheadedness or trouble concentrating, your symptoms may be due to a concussion.  Would recommend mental and physical rest for the next few days as we discussed, Tylenol or Motrin for discomfort.  Please return to the Emergency Department if you experience any worsening of your condition.  Thank you for allowing [place] to be a part of your care.

## 2022-10-31 ENCOUNTER — Ambulatory Visit: Payer: Medicaid Other | Admitting: Student

## 2022-10-31 ENCOUNTER — Ambulatory Visit: Payer: Medicaid Other | Admitting: Family Medicine

## 2022-11-11 ENCOUNTER — Ambulatory Visit (INDEPENDENT_AMBULATORY_CARE_PROVIDER_SITE_OTHER): Payer: Medicaid Other | Admitting: Student

## 2022-11-11 ENCOUNTER — Encounter: Payer: Self-pay | Admitting: Student

## 2022-11-11 VITALS — BP 108/70 | HR 83 | Ht 67.0 in | Wt 111.0 lb

## 2022-11-11 DIAGNOSIS — Z3009 Encounter for other general counseling and advice on contraception: Secondary | ICD-10-CM

## 2022-11-11 DIAGNOSIS — L700 Acne vulgaris: Secondary | ICD-10-CM | POA: Diagnosis not present

## 2022-11-11 MED ORDER — CLINDAMYCIN PHOS-BENZOYL PEROX 1.2-5 % EX GEL: 1.0000 "application " | Freq: Two times a day (BID) | CUTANEOUS | 2 refills | Status: DC

## 2022-11-11 MED ORDER — MEDROXYPROGESTERONE ACETATE 150 MG/ML IM SUSY
150.0000 mg | PREFILLED_SYRINGE | INTRAMUSCULAR | Status: AC
  Administered 2022-11-11: 150 mg via INTRAMUSCULAR

## 2022-11-11 NOTE — Patient Instructions (Signed)
It was wonderful to see you today. Thank you for allowing me to be a part of your care. Below is a short summary of what we discussed at your visit today:  You received your Depo-Provera shot today.  You should return between June 22-July 6 for your next injections   If you have any questions or concerns, please do not hesitate to contact us via phone or MyChart message.   Jerre Simon, MD [facility]

## 2022-11-11 NOTE — Progress Notes (Signed)
    SUBJECTIVE:   CHIEF COMPLAINT / HPI:   Patient is a 22 year old female presenting today for contraceptives.  Patient has previously been on Depo-Provera injection  and last administered 08/15/2022 and due for injection. She endorses good tolerance to her injections.  With her current contraceptives she reports no bleeding for 2 months and then will have intermittent spotting 2 weeks with last spotting yesterday.  Denies any significant weight gain, mood changes, headache, nausea or vomiting.   PERTINENT  PMH / PSH: Reviewed   OBJECTIVE:   BP 108/70   Pulse 83   Ht 5\' 7"  (1.702 m)   Wt 111 lb (50.3 kg)   SpO2 98%   BMI 17.39 kg/m    Physical Exam General: Alert, well appearing, NAD Cardiovascular: Regular rate, well-perfused Respiratory: Normal work of breathing on room air Abdomen: No distension or tenderness Extremities: No edema on extremities    ASSESSMENT/PLAN:   Contraceptive  No urine pregnancy test collected as patient is still within her protected dates from last Depo shot.  Patient denies any nausea, vomiting, mood changes or any significant weight gain although she expressed interest in gaining weight.  - Depo vera shot administer by RN Illene Labrador.  -Counsel patient on contraceptive and answered her questions.   Next due date: June 22 to July 6th  Jerre Simon, MD [facility]

## 2022-12-09 ENCOUNTER — Encounter: Payer: Self-pay | Admitting: Student

## 2022-12-09 ENCOUNTER — Ambulatory Visit: Payer: Medicaid Other | Admitting: Student

## 2022-12-09 VITALS — BP 108/73 | HR 83 | Temp 98.4°F | Ht 67.0 in | Wt 112.4 lb

## 2022-12-09 DIAGNOSIS — K644 Residual hemorrhoidal skin tags: Secondary | ICD-10-CM

## 2022-12-09 MED ORDER — LIDOCAINE 5 % EX OINT: 1.0000 | TOPICAL_OINTMENT | CUTANEOUS | 0 refills | Status: AC | PRN

## 2022-12-09 MED ORDER — METAMUCIL SMOOTH TEXTURE 58.6 % PO POWD: 1.0000 | Freq: Three times a day (TID) | ORAL | 12 refills | Status: AC

## 2022-12-09 NOTE — Assessment & Plan Note (Signed)
3 days of acute pain due to external hemorrhoid.  Does not appear thrombosed on my examination.  Given length of time since onset did not believe that ligating in office would be helpful for pain control currently. -Lidocaine ointment for pain control -Metamucil to help loosen stools, discussed increasing fluid intake -Sitz bath's -Symptomatic measures -If not improving discussed with patient to please return

## 2022-12-09 NOTE — Progress Notes (Signed)
    SUBJECTIVE:   CHIEF COMPLAINT / HPI: Hemmorhoids  Hemmorhoids-hurting a lot  Says that it was originally inside but then came out around 3 days ago and has been having acute pain Sometimes she does have very hard bowel movements.  She is not taking anything in terms of bowel regimen She has been using the Anusol 4-5 times in morning and when she wants to sleep  PERTINENT  PMH / PSH:   OBJECTIVE:   BP 108/73   Pulse 83   Temp 98.4 F (36.9 C)   Ht 5\' 7"  (1.702 m)   Wt 112 lb 6.4 oz (51 kg)   SpO2 100%   BMI 17.60 kg/m   General: Well appearing, NAD, awake, alert, responsive to questions Head: Normocephalic atraumatic Respiratory: chest rises symmetrically,  no increased work of breathing Rectum: External hemmorhoid present, no bleeding or thrombosed appearance, tenderness to palpation  Chaperone: Tashira Leggette CMA  ASSESSMENT/PLAN:   External hemorrhoid 3 days of acute pain due to external hemorrhoid.  Does not appear thrombosed on my examination.  Given length of time since onset did not believe that ligating in office would be helpful for pain control currently. -Lidocaine ointment for pain control -Metamucil to help loosen stools, discussed increasing fluid intake -Sitz bath's -Symptomatic measures -If not improving discussed with patient to please return   Levin Erp, MD [facility]

## 2022-12-09 NOTE — Patient Instructions (Signed)
It was great to see you! Thank you for allowing me to participate in your care!   Our plans for today:  -I am sending in Metamucil to take 3 times daily and I would like you to drink lots of water with this -I am also sending in topical lidocaine to put on for this pain -Please take sitz bath such I included above as well -Please return if not improving or worsening  Take care and seek immediate care sooner if you develop any concerns.  Levin Erp, MD

## 2023-01-25 ENCOUNTER — Ambulatory Visit
Admission: EM | Admit: 2023-01-25 | Discharge: 2023-01-25 | Disposition: A | Discharge status: Home / Self Care | Payer: Medicaid Other | Attending: Nurse Practitioner | Admitting: Nurse Practitioner

## 2023-01-25 DIAGNOSIS — U071 COVID-19: Secondary | ICD-10-CM | POA: Insufficient documentation

## 2023-01-25 DIAGNOSIS — J069 Acute upper respiratory infection, unspecified: Secondary | ICD-10-CM | POA: Insufficient documentation

## 2023-01-25 DIAGNOSIS — R051 Acute cough: Secondary | ICD-10-CM | POA: Insufficient documentation

## 2023-01-25 MED ORDER — BENZONATATE 200 MG PO CAPS
200.0000 mg | ORAL_CAPSULE | Freq: Three times a day (TID) | ORAL | 0 refills | Status: DC | PRN
Start: 2023-01-25 — End: 2023-07-20

## 2023-01-25 MED ORDER — ALBUTEROL SULFATE HFA 108 (90 BASE) MCG/ACT IN AERS
1.0000 | INHALATION_SPRAY | Freq: Four times a day (QID) | RESPIRATORY_TRACT | 0 refills | Status: AC | PRN
Start: 2023-01-25 — End: ?

## 2023-01-25 MED ORDER — IPRATROPIUM BROMIDE 0.03 % NA SOLN
2.0000 | Freq: Two times a day (BID) | NASAL | 0 refills | Status: AC
Start: 2023-01-25 — End: ?

## 2023-01-25 NOTE — Discharge Instructions (Signed)
The clinic will contact you with results of the COVID testing done today if positive Atrovent nasal spray twice daily as needed for congestion Albuterol inhaler as needed for shortness of breath Tessalon 3 times a day as needed for cough Rest and fluids Follow-up with your PCP if your symptoms do not improve Please go to the emergency room for any worsening symptoms Hope you feel better soon!

## 2023-01-25 NOTE — ED Provider Notes (Addendum)
[facility]    CSN: [id number] Arrival date & time: 01/25/23  1145      History   Chief Complaint Chief Complaint  Patient presents with   Cough    HPI Melanie Maddox is a 22 y.o. female  presents for evaluation of URI symptoms for 4 days. Patient reports associated symptoms of cough, congestion, sore throat, shortness of breath with coughing. Denies N/V/D, fevers, ear pain, body aches. Patient does not have a hx of asthma or smoking. No known sick contacts.  Pt has taken nothing OTC for symptoms. Pt has no other concerns at this time.    Cough Associated symptoms: shortness of breath and sore throat     Past Medical History:  Diagnosis Date   Anemia     Patient Active Problem List   Diagnosis Date Noted   External hemorrhoid 08/05/2022   Underweight 10/26/2021   Current mild episode of major depressive disorder without prior episode (HCC) 10/26/2021   Epistaxis 10/14/2021   Encounter for contraceptive management 09/16/2021   Menorrhagia with irregular cycle 05/04/2021   Patellofemoral pain syndrome of both knees 11/11/2020   Acne vulgaris 09/18/2020   Anxiety state 07/09/2020   Sports physical 05/27/2020   Birth control counseling 03/19/2020    History reviewed. No pertinent surgical history.  OB History     Gravida  0   Para  0   Term  0   Preterm  0   AB  0   Living  0      SAB  0   IAB  0   Ectopic  0   Multiple  0   Live Births  0        Obstetric Comments  Has been having having periods since age 27.           Home Medications    Prior to Admission medications   Medication Sig Start Date End Date Taking? Authorizing Provider  albuterol (VENTOLIN HFA) 108 (90 Base) MCG/ACT inhaler Inhale 1-2 puffs into the lungs every 6 (six) hours as needed for wheezing or shortness of breath. 01/25/23  Yes Radford Pax, NP  benzonatate (TESSALON) 200 MG capsule Take 1 capsule (200 mg total) by mouth 3 (three) times daily  as needed for cough. 01/25/23  Yes Radford Pax, NP  ipratropium (ATROVENT) 0.03 % nasal spray Place 2 sprays into both nostrils every 12 (twelve) hours. 01/25/23  Yes Radford Pax, NP  Clindamycin-Benzoyl Per, Refr, gel Apply 1 application  topically 2 (two) times daily. 11/11/22   Jerre Simon, MD  hydrocortisone (ANUSOL-HC) 2.5 % rectal cream Place 1 Application rectally 2 (two) times daily. 08/05/22   Levin Erp, MD  hydrOXYzine (ATARAX) 25 MG tablet Take 1 tablet (25 mg total) by mouth every 6 (six) hours as needed for anxiety. 01/15/22   Peter Garter, PA  ibuprofen (ADVIL) 400 MG tablet Take 1 tablet (400 mg total) by mouth every 6 (six) hours as needed. 05/13/20   Lamptey, Britta Mccreedy, MD  lidocaine (XYLOCAINE) 5 % ointment Apply 1 Application topically as needed. 12/09/22   Levin Erp, MD  medroxyPROGESTERone (DEPO-PROVERA) 150 MG/ML injection Inject 150 mg into the muscle every 3 (three) months.    [provider]  psyllium (METAMUCIL SMOOTH TEXTURE) 58.6 % powder Take 1 packet by mouth 3 (three) times daily. 12/09/22   Levin Erp, MD  shark liver oil-cocoa butter (PREPARATION H) 0.25-88.44 % suppository Place 1 suppository rectally  as needed for hemorrhoids. 08/05/22   Levin Erp, MD    Family History Family History  Problem Relation Age of Onset   Hypertension Mother     Social History Social History   Tobacco Use   Smoking status: Never   Smokeless tobacco: Never  Vaping Use   Vaping Use: Never used  Substance Use Topics   Alcohol use: Never   Drug use: Never     Allergies   Patient has no known allergies.   Review of Systems Review of Systems  HENT:  Positive for congestion and sore throat.   Respiratory:  Positive for cough and shortness of breath.      Physical Exam Triage Vital Signs ED Triage Vitals  Enc Vitals Group     BP 01/25/23 1211 109/67     Pulse Rate 01/25/23 1211 76     Resp 01/25/23 1211 17     Temp 01/25/23  1211 99.1 F (37.3 C)     Temp Source 01/25/23 1211 Oral     SpO2 01/25/23 1211 99 %     Weight --      Height --      Head Circumference --      Peak Flow --      Pain Score 01/25/23 1210 3     Pain Loc --      Pain Edu? --      Excl. in GC? --    No data found.  Updated Vital Signs BP 109/67 (BP Location: Right Arm)   Pulse 76   Temp 99.1 F (37.3 C) (Oral)   Resp 17   LMP 12/29/2022 (Exact Date)   SpO2 99%   Visual Acuity Right Eye Distance:   Left Eye Distance:   Bilateral Distance:    Right Eye Near:   Left Eye Near:    Bilateral Near:     Physical Exam Vitals and nursing note reviewed.  Constitutional:      General: She is not in acute distress.    Appearance: She is well-developed. She is not ill-appearing.  HENT:     Head: Normocephalic and atraumatic.     Right Ear: Tympanic membrane and ear canal normal.     Left Ear: Tympanic membrane and ear canal normal.     Nose: Congestion present.     Mouth/Throat:     Mouth: Mucous membranes are moist.     Pharynx: Oropharynx is clear. Uvula midline. Posterior oropharyngeal erythema present.     Tonsils: No tonsillar exudate or tonsillar abscesses.  Eyes:     Conjunctiva/sclera: Conjunctivae normal.     Pupils: Pupils are equal, round, and reactive to light.  Cardiovascular:     Rate and Rhythm: Normal rate and regular rhythm.     Heart sounds: Normal heart sounds.  Pulmonary:     Effort: Pulmonary effort is normal.     Breath sounds: Normal breath sounds.  Musculoskeletal:     Cervical back: Normal range of motion and neck supple.  Lymphadenopathy:     Cervical: No cervical adenopathy.  Skin:    General: Skin is warm and dry.  Neurological:     General: No focal deficit present.     Mental Status: She is alert and oriented to person, place, and time.  Psychiatric:        Mood and Affect: Mood normal.        Behavior: Behavior normal.      UC Treatments / Results  Labs (  all labs ordered are  listed, but only abnormal results are displayed) Labs Reviewed  SARS CORONAVIRUS 2 (TAT 6-24 HRS)   Basic metabolic panel Order: [id number] Status: Final result     Visible to patient: Yes (seen)     Next appt: 01/30/2023 at 09:00 AM in Family [facility] Nurse)   0 Result Notes      Component Ref Range & Units 1 yr ago (01/15/22) 1 yr ago (11/30/21) 3 yr ago (02/26/19)  Sodium 135 - 145 mmol/L 139 137 140 R  Potassium 3.5 - 5.1 mmol/L 3.6 3.8 4.6 R  Chloride 98 - 111 mmol/L 109 108 102 R  CO2 22 - 32 mmol/L 20 Low   19 Low  R  Glucose, Bld 70 - 99 mg/dL 87 74 CM 73 R  Comment: Glucose reference range applies only to samples taken after fasting for at least 8 hours.  BUN 6 - 20 mg/dL 15 13 16   Creatinine, Ser 0.44 - 1.00 mg/dL 4.09 8.11 Low  9.14 R  Calcium 8.9 - 10.3 mg/dL 9.5  9.8 R  GFR, Estimated >60 mL/min >60    Comment: (NOTE) Calculated using the CKD-EPI Creatinine Equation (2021)  Anion gap 5 - 15 10    Comment: Performed at [facility], [address], [place], [place] [address]  Resulting Agency [facility]         Specimen Collected: 01/15/22 06:18 Last Resulted: 01/15/22 07:35        EKG   Radiology No results found.  Procedures Procedures (including critical care time)  Medications Ordered in UC Medications - No data to display  Initial Impression / Assessment and Plan / UC Course  I have reviewed the triage vital signs and the nursing notes.  Pertinent labs & imaging results that were available during my care of the patient were reviewed by me and considered in my medical decision making (see chart for details).     Reviewed exam and symptoms with patient.  No red flags.  COVID PCR and will contact if positive Discussed viral illness and symptomatic treatment.  Atrovent nasal spray, albuterol inhaler as needed, Tessalon as needed for cough Rest and fluids PCP follow-up if symptoms do not improve ER  precautions reviewed and patient verbalized understanding Final Clinical Impressions(s) / UC Diagnoses   Final diagnoses:  Acute cough  Viral upper respiratory illness     Discharge Instructions      The clinic will contact you with results of the COVID testing done today if positive Atrovent nasal spray twice daily as needed for congestion Albuterol inhaler as needed for shortness of breath Tessalon 3 times a day as needed for cough Rest and fluids Follow-up with your PCP if your symptoms do not improve Please go to the emergency room for any worsening symptoms Hope you feel better soon!    ED Prescriptions     Medication Sig Dispense Auth. Provider   benzonatate (TESSALON) 200 MG capsule Take 1 capsule (200 mg total) by mouth 3 (three) times daily as needed for cough. 20 capsule Radford Pax, NP   ipratropium (ATROVENT) 0.03 % nasal spray Place 2 sprays into both nostrils every 12 (twelve) hours. 30 mL Radford Pax, NP   albuterol (VENTOLIN HFA) 108 (90 Base) MCG/ACT inhaler Inhale 1-2 puffs into the lungs every 6 (six) hours as needed for wheezing or shortness of breath. 1 each Radford Pax, NP  PDMP not reviewed this encounter.   Radford Pax, NP 01/25/23 1248    Radford Pax, NP 01/25/23 1248

## 2023-01-25 NOTE — ED Triage Notes (Signed)
Pt presents with c/o SOB and chest pain when coughing X 4 days.   Pt has not taken anything for relief.

## 2023-01-26 LAB — SARS CORONAVIRUS 2 (TAT 6-24 HRS): SARS Coronavirus 2: POSITIVE — AB

## 2023-01-30 ENCOUNTER — Ambulatory Visit (INDEPENDENT_AMBULATORY_CARE_PROVIDER_SITE_OTHER): Payer: Medicaid Other

## 2023-01-30 ENCOUNTER — Ambulatory Visit (INDEPENDENT_AMBULATORY_CARE_PROVIDER_SITE_OTHER): Payer: Medicaid Other | Admitting: Family Medicine

## 2023-01-30 VITALS — BP 104/70 | HR 93

## 2023-01-30 DIAGNOSIS — R040 Epistaxis: Secondary | ICD-10-CM

## 2023-01-30 DIAGNOSIS — Z3042 Encounter for surveillance of injectable contraceptive: Secondary | ICD-10-CM

## 2023-01-30 MED ORDER — MEDROXYPROGESTERONE ACETATE 150 MG/ML IM SUSY
150.0000 mg | PREFILLED_SYRINGE | Freq: Once | INTRAMUSCULAR | Status: AC
Start: 2023-01-30 — End: 2023-01-30
  Administered 2023-01-30: 150 mg via INTRAMUSCULAR

## 2023-01-30 NOTE — Assessment & Plan Note (Signed)
Patient presents for recent nosebleeds and has a hx of epistaxis. Potential causes include recent respiratory infection, allergies, dry air. She is not on anticoagulation, and no family history of blood disorders. Vitals stable. Last CBC about a year ago with a hemoglobin of 10. Will repeat today. Also recommended Flonase given boggy nasal turbinates on exam and allergy symptoms. She has the medication at home and denies needing refill.  - CBC  - Flonase daily

## 2023-01-30 NOTE — Progress Notes (Signed)
Patient here today for Depo Provera injection and is within her dates.    Last contraceptive appt was 11/11/22   Depo given in RUOQ today.  Site unremarkable & patient tolerated injection.    Next injection due 04/17/23-05/01/23.  Reminder card given.    Veronda Prude, RN

## 2023-01-30 NOTE — Progress Notes (Signed)
    SUBJECTIVE:   CHIEF COMPLAINT / HPI:   Patient presents for nosebleeds. Saturday had 1 episode, Sunday had 4 episodes and once today. Lasts for 5-10 minutes. Heavy with clotting. Washes face with cold water to stop it. Has felt light headed after episodes. She has a history of epistaxis. Not on anticoagulation. No family history of blood disorders.    PERTINENT  PMH / PSH: Reviewed   OBJECTIVE:   BP 104/70   Pulse 93   LMP 12/29/2022 (Exact Date)   SpO2 98%    Physical exam General: well appearing, NAD HEENT: MMM. No blood in nares. Boggy nasal turbinates  Cardiovascular: RRR, no murmurs Lungs: CTAB. Normal WOB Abdomen: soft, non-distended Skin: warm, dry. No edema  ASSESSMENT/PLAN:   Epistaxis Patient presents for recent nosebleeds and has a hx of epistaxis. Potential causes include recent respiratory infection, allergies, dry air. She is not on anticoagulation, and no family history of blood disorders. Vitals stable. Last CBC about a year ago with a hemoglobin of 10. Will repeat today. Also recommended Flonase given boggy nasal turbinates on exam and allergy symptoms. She has the medication at home and denies needing refill.  - CBC  - Flonase daily     Cora Collum, DO [facility]

## 2023-01-30 NOTE — Patient Instructions (Addendum)
It was great seeing you today!  Your allergies could be contributing to the bleeding, as well as the dry air and also your recent infection.  As we discussed I recommend using Flonase in each nostril daily. Also apply vaseline in your nose daily. When you do have the bleeds please apply pressure on your nose and lean your head forward and that will help stop them. we will check your blood levels today and I will let you know if anything is abnormal.  Call us if your nosebleeds worsen despite using the medication, or you have heavy clots that does not stop in 30 minutes  Feel free to call with any questions or concerns at any time, at [phone].   Take care,  Dr. Cora Collum [facility]

## 2023-01-31 LAB — CBC
Hematocrit: 37.9 % (ref 34.0–46.6)
Hemoglobin: 11.9 g/dL (ref 11.1–15.9)
MCH: 27.4 pg (ref 26.6–33.0)
MCHC: 31.4 g/dL — ABNORMAL LOW (ref 31.5–35.7)
MCV: 87 fL (ref 79–97)
Platelets: 197 10*3/uL (ref 150–450)
RBC: 4.34 x10E6/uL (ref 3.77–5.28)
RDW: 12.6 % (ref 11.7–15.4)
WBC: 4.6 10*3/uL (ref 3.4–10.8)

## 2023-07-17 ENCOUNTER — Ambulatory Visit: Payer: 59 | Admitting: Student

## 2023-07-20 ENCOUNTER — Ambulatory Visit (INDEPENDENT_AMBULATORY_CARE_PROVIDER_SITE_OTHER): Payer: 59 | Admitting: Student

## 2023-07-20 ENCOUNTER — Encounter: Payer: Self-pay | Admitting: Student

## 2023-07-20 VITALS — BP 100/75 | HR 78 | Wt 128.4 lb

## 2023-07-20 DIAGNOSIS — Z23 Encounter for immunization: Secondary | ICD-10-CM | POA: Diagnosis not present

## 2023-07-20 DIAGNOSIS — Z3009 Encounter for other general counseling and advice on contraception: Secondary | ICD-10-CM

## 2023-07-20 DIAGNOSIS — L700 Acne vulgaris: Secondary | ICD-10-CM | POA: Diagnosis not present

## 2023-07-20 LAB — POCT URINE PREGNANCY: Preg Test, Ur: NEGATIVE

## 2023-07-20 MED ORDER — MEDROXYPROGESTERONE ACETATE 150 MG/ML IM SUSY
150.0000 mg | PREFILLED_SYRINGE | Freq: Once | INTRAMUSCULAR | Status: AC
Start: 2023-07-20 — End: 2023-07-20
  Administered 2023-07-20: 150 mg via INTRAMUSCULAR

## 2023-07-20 MED ORDER — CLINDAMYCIN PHOS-BENZOYL PEROX 1.2-5 % EX GEL
1.0000 | Freq: Two times a day (BID) | CUTANEOUS | 2 refills | Status: AC
Start: 2023-07-20 — End: ?

## 2023-07-20 NOTE — Progress Notes (Signed)
Patient was not within her dates for Depo shot and was tested for pregnancy.  Pregnancy test was negative.  Depo-Provera was given in RUOQ  in which patient tolerated it well.  Patient was given a reminder card of 10/05/2023- 0313/2025 and expessed understanding of dates.  Glennie Hawk, CMA
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
## 2023-07-20 NOTE — Assessment & Plan Note (Addendum)
Pregnancy test today was negative and patient received her Depo-Provera shots today.  Next due date will be 10/05/2023.

## 2023-07-20 NOTE — Patient Instructions (Addendum)
Great to see you today  Your pregnancy test today was Negative   We gave you your Depo-Provera shot which will be due in 3 months.  Suspect your absent period is most likely due to the effects of your Depo-Provera shots.  We also gave you your flu vaccine today.   Use an acne face wash that contains Benzoyl Peroxide (or Cerave/ Cetaphil face wash) 2 times a day every day to wash your face. It will likely stain your towel and pillowcase, so use the same towel every time and try to use the same pillowcase.   - Benzoyl Peroxide - start at 2.5%  Use a face toner (Witch Hazel) with cotton over the face  Apply a pea-sized amount of Benzaclin cream to your face twice a day.  I have sent in prescription to your pharmacy.

## 2023-08-09 DIAGNOSIS — S065XAA Traumatic subdural hemorrhage with loss of consciousness status unknown, initial encounter: Secondary | ICD-10-CM | POA: Diagnosis not present

## 2023-08-09 DIAGNOSIS — S065X0A Traumatic subdural hemorrhage without loss of consciousness, initial encounter: Secondary | ICD-10-CM | POA: Diagnosis not present

## 2023-08-09 DIAGNOSIS — S299XXA Unspecified injury of thorax, initial encounter: Secondary | ICD-10-CM | POA: Diagnosis not present

## 2023-08-09 DIAGNOSIS — S20411A Abrasion of right back wall of thorax, initial encounter: Secondary | ICD-10-CM | POA: Diagnosis not present

## 2023-08-09 DIAGNOSIS — S199XXA Unspecified injury of neck, initial encounter: Secondary | ICD-10-CM | POA: Diagnosis not present

## 2023-08-09 DIAGNOSIS — S0101XA Laceration without foreign body of scalp, initial encounter: Secondary | ICD-10-CM | POA: Diagnosis not present

## 2023-08-09 DIAGNOSIS — S0181XA Laceration without foreign body of other part of head, initial encounter: Secondary | ICD-10-CM | POA: Diagnosis not present

## 2023-08-09 DIAGNOSIS — S3991XA Unspecified injury of abdomen, initial encounter: Secondary | ICD-10-CM | POA: Diagnosis not present

## 2023-08-15 ENCOUNTER — Ambulatory Visit: Payer: 59

## 2023-08-15 VITALS — BP 111/86 | HR 86 | Wt 123.2 lb

## 2023-08-15 DIAGNOSIS — Z5941 Food insecurity: Secondary | ICD-10-CM

## 2023-08-15 DIAGNOSIS — S060XAA Concussion with loss of consciousness status unknown, initial encounter: Secondary | ICD-10-CM | POA: Diagnosis not present

## 2023-08-15 NOTE — Patient Instructions (Signed)
 It was great to see you! Thank you for allowing me to participate in your care!  Our plans for today:  - You may use Tylenol and Ibuprofen  as needed for pain. - Please do not do any activities that worsen your concussion symptoms. - I will see you on Friday at 2:10pm. - Please make sure to wash your hair and take out your braids.    Please arrive 15 minutes PRIOR to your next scheduled appointment time! If you do not, this affects OTHER patients' care.  Take care and seek immediate care sooner if you develop any concerns.   Ozell Provencal, MD, PGY-2 [facility] 10:54 AM 08/15/2023  [facility]
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
## 2023-08-16 ENCOUNTER — Telehealth: Payer: Self-pay

## 2023-08-16 NOTE — Progress Notes (Signed)
 Complex Care Management Note Care Guide Note  08/16/2023 Name: Melanie Maddox MRN: [id number] DOB: 10/01/2000  Melanie Maddox is a 23 y.o. year old female who is a primary care patient of Rosendo Rush, MD . The community resource team was consulted for assistance with Food Insecurity  SDOH screenings and interventions completed:  Yes  SDOH Interventions Today    Flowsheet Row Most Recent Value  SDOH Interventions   Food Insecurity Interventions Other (Comment)  [Spoke with patient about Dietitian she request the information be emailed to her. I asked if patient needed assistance with anything else and she responded only food at this time. The site also provides housing, healthcare and mental health.]        Care guide performed the following interventions: Spoke with patient about [place] [employer]/app she request the information be emailed to her. I asked if patient needed assistance with anything else and she responded only food at this time. [employer] [employer]/app site also provides information for housing, healthcare and mental health.  Follow Up Plan:  No further follow up planned at this time. The patient has been provided with needed resources.  Encounter Outcome:  Patient Visit Completed  Avarie Tavano [facility]  [facility] Care Guide Direct Dial: [phone]  Website: [email or web address]

## 2023-08-18 ENCOUNTER — Ambulatory Visit: Payer: Self-pay | Admitting: Family Medicine

## 2023-08-22 ENCOUNTER — Ambulatory Visit (INDEPENDENT_AMBULATORY_CARE_PROVIDER_SITE_OTHER): Payer: 59 | Admitting: Student

## 2023-08-22 VITALS — BP 120/80 | HR 79 | Wt 127.6 lb

## 2023-08-22 DIAGNOSIS — S060XAD Concussion with loss of consciousness status unknown, subsequent encounter: Secondary | ICD-10-CM

## 2023-08-22 NOTE — Patient Instructions (Addendum)
 It was great seeing you today.  As we discussed, -Continue to take Tylenol as needed for your headache -I sent a referral to the vestibular physical therapist.  They will call you to schedule. -Please schedule follow-up visit in 4 weeks   If you have any questions or concerns, please feel free to call the clinic.   Have a wonderful day,  Dr. Barabara Dama [facility] [phone]
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
## 2023-08-22 NOTE — Assessment & Plan Note (Signed)
 Continued mild symptoms, but overall improving. Neurological exam is completely benign today. Discussed that symptoms may persist for up to 4 to 6 weeks.  Encouraged to continue to avoid excessive screen time, get good quality sleep and start to incorporate some physical activity. May continue use Tylenol and ibuprofen  as needed.  Patient is to call [facility] to schedule an appointment her discharge plan from ED, phone number was provided to her. Unsure when she might need repeat CT head, will defer to neurosurgery.  No urgent indication today.  Given persistence of symptoms, placed referral to vestibular physical therapy.

## 2023-08-31 ENCOUNTER — Encounter: Payer: Self-pay | Admitting: Student

## 2023-09-19 ENCOUNTER — Ambulatory Visit (INDEPENDENT_AMBULATORY_CARE_PROVIDER_SITE_OTHER): Payer: 59 | Admitting: Student

## 2023-09-19 ENCOUNTER — Encounter: Payer: Self-pay | Admitting: Student

## 2023-09-19 VITALS — BP 129/83 | HR 99 | Ht 67.0 in | Wt 132.2 lb

## 2023-09-19 DIAGNOSIS — Z4802 Encounter for removal of sutures: Secondary | ICD-10-CM | POA: Diagnosis not present
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
## 2023-11-06 ENCOUNTER — Ambulatory Visit

## 2023-11-06 DIAGNOSIS — Z3042 Encounter for surveillance of injectable contraceptive: Secondary | ICD-10-CM | POA: Diagnosis not present

## 2023-11-06 LAB — POCT URINE PREGNANCY: Preg Test, Ur: NEGATIVE

## 2023-11-06 NOTE — Progress Notes (Unsigned)
 Patient here today for Depo Provera injection and is within her dates.    Last contraceptive appt was 07/20/2023  Depo given in *** today.  Site unremarkable & patient tolerated injection.    Next injection due 01/22/24-02/05/24.  Reminder card given.    Veronda Prude, RN

## 2023-11-07 MED ORDER — MEDROXYPROGESTERONE ACETATE 150 MG/ML IM SUSP
150.0000 mg | Freq: Once | INTRAMUSCULAR | Status: AC
Start: 2023-11-07 — End: 2023-11-06
  Administered 2023-11-06: 150 mg via INTRAMUSCULAR

## 2024-01-29 ENCOUNTER — Ambulatory Visit: Admitting: Student

## 2024-01-29 ENCOUNTER — Encounter: Payer: Self-pay | Admitting: Student

## 2024-01-29 DIAGNOSIS — Z3009 Encounter for other general counseling and advice on contraception: Secondary | ICD-10-CM | POA: Diagnosis not present

## 2024-01-29 DIAGNOSIS — Z3042 Encounter for surveillance of injectable contraceptive: Secondary | ICD-10-CM | POA: Diagnosis not present

## 2024-01-29 MED ORDER — MEDROXYPROGESTERONE ACETATE 150 MG/ML IM SUSY
150.0000 mg | PREFILLED_SYRINGE | Freq: Once | INTRAMUSCULAR | Status: AC
Start: 2024-01-29 — End: 2024-01-29
  Administered 2024-01-29: 150 mg via INTRAMUSCULAR

## 2024-01-29 NOTE — Assessment & Plan Note (Signed)
 Patient within the due date for administration of Depo injection Depo-shot was ministered today by [employer]. Patient tolerated injection. Next injection due 9/082025-04/29/2024

## 2024-01-29 NOTE — Progress Notes (Signed)
 Patient here today for Depo Provera  injection and is within her dates.       Depo given in RUOQ today.  Site unremarkable & patient tolerated injection.     Next injection due 9/082025-04/29/2024  Reminder card given.    Cena JONELLE Pesa, CMA

## 2024-01-29 NOTE — Progress Notes (Signed)
    SUBJECTIVE:   CHIEF COMPLAINT / HPI:   23 year old female with no significant past medical history is scheduled to be seen today for contraceptive management.  Currently on Depo-Provera  shots presented today to receive Depo-Provera  shots.  Patient presented late to her scheduled appointment today and was seen by the CMA who administered her depo-shot.   PERTINENT  PMH / PSH: Reviewed   OBJECTIVE:   No vitals obtained for this visit   Physical Exam General: well appearing, NAD  ASSESSMENT/PLAN:   Birth control counseling Patient within the due date for administration of Depo injection Depo-shot was ministered today by [employer]. Patient tolerated injection. Next injection due 9/082025-04/29/2024      Norleen April, MD [facility]

## 2024-02-01 ENCOUNTER — Ambulatory Visit: Admitting: Family Medicine

## 2024-04-29 ENCOUNTER — Ambulatory Visit (INDEPENDENT_AMBULATORY_CARE_PROVIDER_SITE_OTHER)

## 2024-04-29 DIAGNOSIS — Z3042 Encounter for surveillance of injectable contraceptive: Secondary | ICD-10-CM

## 2024-04-29 MED ORDER — MEDROXYPROGESTERONE ACETATE 150 MG/ML IM SUSP
150.0000 mg | Freq: Once | INTRAMUSCULAR | Status: AC
Start: 2024-04-29 — End: 2024-04-29
  Administered 2024-04-29: 150 mg via INTRAMUSCULAR

## 2024-04-29 NOTE — Progress Notes (Signed)
 Patient here today for Depo Provera  injection and is within her dates.    Last contraceptive appt was 01/29/2024.  Depo given in RUOQ today.  Site unremarkable & patient tolerated injection.    Next injection due 07/15/2024-07/29/2024.  Reminder card given.

## 2024-07-21 ENCOUNTER — Ambulatory Visit
Admission: RE | Admit: 2024-07-21 | Discharge: 2024-07-21 | Disposition: A | Discharge status: Home / Self Care | Source: Ambulatory Visit | Attending: Family Medicine

## 2024-07-21 VITALS — BP 134/82 | HR 92 | Temp 98.5°F | Resp 18

## 2024-07-21 DIAGNOSIS — N926 Irregular menstruation, unspecified: Secondary | ICD-10-CM

## 2024-07-21 LAB — POCT URINE PREGNANCY: Preg Test, Ur: NEGATIVE

## 2024-07-21 NOTE — ED Triage Notes (Signed)
 Pt present with missed period x 2 months. Pt states she recently stopped her birth control.
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
## 2024-07-21 NOTE — Discharge Instructions (Addendum)
 Please follow-up with your PCP or gynecologist for further workup of your missed period

## 2024-09-13 ENCOUNTER — Emergency Department (HOSPITAL_COMMUNITY)

## 2024-09-13 ENCOUNTER — Emergency Department (HOSPITAL_COMMUNITY)
Admission: EM | Admit: 2024-09-13 | Discharge: 2024-09-13 | Disposition: A | Discharge status: Home / Self Care | Source: Home / Self Care | Attending: Emergency Medicine | Admitting: Emergency Medicine

## 2024-09-13 ENCOUNTER — Encounter (HOSPITAL_COMMUNITY): Payer: Self-pay

## 2024-09-13 ENCOUNTER — Other Ambulatory Visit: Payer: Self-pay

## 2024-09-13 MED ORDER — NAPROXEN 500 MG PO TABS: 500.0000 mg | ORAL_TABLET | Freq: Two times a day (BID) | ORAL | 0 refills | Status: AC

## 2024-09-13 MED ORDER — CYCLOBENZAPRINE HCL 10 MG PO TABS: 10.0000 mg | ORAL_TABLET | Freq: Two times a day (BID) | ORAL | 0 refills | Status: AC | PRN

## 2024-09-13 MED ORDER — ACETAMINOPHEN 325 MG PO TABS
650.0000 mg | ORAL_TABLET | Freq: Once | ORAL | Status: AC
  Administered 2024-09-13: 650 mg via ORAL
  Filled 2024-09-13: qty 2

## 2024-09-13 MED ORDER — IBUPROFEN 200 MG PO TABS
600.0000 mg | ORAL_TABLET | Freq: Once | ORAL | Status: AC
  Administered 2024-09-13: 600 mg via ORAL
  Filled 2024-09-13: qty 3
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
